# Patient Record
Sex: Male | Born: 1950 | Race: White | Hispanic: No | Marital: Married | Smoking: Former smoker
Health system: Southern US, Community
[De-identification: ages and names within clinical notes are randomized; demographics above are authoritative.]

## PROBLEM LIST (undated history)

## (undated) HISTORY — PX: OTHER SURGICAL HISTORY: SHX169

---

## 2011-09-19 DIAGNOSIS — D352 Benign neoplasm of pituitary gland: Secondary | ICD-10-CM | POA: Insufficient documentation

## 2017-07-08 DIAGNOSIS — N400 Enlarged prostate without lower urinary tract symptoms: Secondary | ICD-10-CM | POA: Insufficient documentation

## 2017-07-29 DIAGNOSIS — C61 Malignant neoplasm of prostate: Secondary | ICD-10-CM | POA: Insufficient documentation

## 2017-10-14 DIAGNOSIS — C61 Malignant neoplasm of prostate: Secondary | ICD-10-CM

## 2017-10-14 HISTORY — DX: Malignant neoplasm of prostate: C61

## 2019-11-14 ENCOUNTER — Ambulatory Visit: Payer: Self-pay

## 2019-11-21 ENCOUNTER — Ambulatory Visit: Payer: Self-pay

## 2019-11-25 ENCOUNTER — Ambulatory Visit: Payer: Self-pay

## 2020-03-29 DIAGNOSIS — H25043 Posterior subcapsular polar age-related cataract, bilateral: Secondary | ICD-10-CM | POA: Insufficient documentation

## 2021-03-07 ENCOUNTER — Observation Stay (HOSPITAL_BASED_OUTPATIENT_CLINIC_OR_DEPARTMENT_OTHER)
Admission: EM | Admit: 2021-03-07 | Discharge: 2021-03-08 | Disposition: A | Discharge status: Home / Self Care | Payer: Medicare HMO | Attending: Internal Medicine | Admitting: Internal Medicine

## 2021-03-07 ENCOUNTER — Encounter (HOSPITAL_BASED_OUTPATIENT_CLINIC_OR_DEPARTMENT_OTHER): Payer: Self-pay

## 2021-03-07 ENCOUNTER — Emergency Department (HOSPITAL_COMMUNITY): Payer: Medicare HMO

## 2021-03-07 ENCOUNTER — Other Ambulatory Visit: Payer: Self-pay

## 2021-03-07 DIAGNOSIS — E785 Hyperlipidemia, unspecified: Secondary | ICD-10-CM | POA: Diagnosis not present

## 2021-03-07 DIAGNOSIS — Z923 Personal history of irradiation: Secondary | ICD-10-CM | POA: Diagnosis not present

## 2021-03-07 DIAGNOSIS — I639 Cerebral infarction, unspecified: Secondary | ICD-10-CM | POA: Diagnosis present

## 2021-03-07 DIAGNOSIS — I6389 Other cerebral infarction: Principal | ICD-10-CM | POA: Insufficient documentation

## 2021-03-07 DIAGNOSIS — Z86018 Personal history of other benign neoplasm: Secondary | ICD-10-CM | POA: Diagnosis not present

## 2021-03-07 DIAGNOSIS — H532 Diplopia: Secondary | ICD-10-CM | POA: Diagnosis present

## 2021-03-07 DIAGNOSIS — I10 Essential (primary) hypertension: Secondary | ICD-10-CM | POA: Diagnosis not present

## 2021-03-07 DIAGNOSIS — Z8546 Personal history of malignant neoplasm of prostate: Secondary | ICD-10-CM | POA: Diagnosis not present

## 2021-03-07 DIAGNOSIS — Z20822 Contact with and (suspected) exposure to covid-19: Secondary | ICD-10-CM | POA: Insufficient documentation

## 2021-03-07 DIAGNOSIS — R41841 Cognitive communication deficit: Secondary | ICD-10-CM | POA: Diagnosis not present

## 2021-03-07 DIAGNOSIS — Z87891 Personal history of nicotine dependence: Secondary | ICD-10-CM | POA: Diagnosis not present

## 2021-03-07 DIAGNOSIS — E119 Type 2 diabetes mellitus without complications: Secondary | ICD-10-CM | POA: Diagnosis not present

## 2021-03-07 DIAGNOSIS — R29898 Other symptoms and signs involving the musculoskeletal system: Secondary | ICD-10-CM | POA: Insufficient documentation

## 2021-03-07 LAB — CBC
HCT: 44 % (ref 39.0–52.0)
Hemoglobin: 15 g/dL (ref 13.0–17.0)
MCH: 30.8 pg (ref 26.0–34.0)
MCHC: 34.1 g/dL (ref 30.0–36.0)
MCV: 90.3 fL (ref 80.0–100.0)
Platelets: 135 10*3/uL — ABNORMAL LOW (ref 150–400)
RBC: 4.87 MIL/uL (ref 4.22–5.81)
RDW: 13.1 % (ref 11.5–15.5)
WBC: 5.2 10*3/uL (ref 4.0–10.5)
nRBC: 0 % (ref 0.0–0.2)

## 2021-03-07 LAB — BASIC METABOLIC PANEL
Anion gap: 8 (ref 5–15)
BUN: 16 mg/dL (ref 8–23)
CO2: 25 mmol/L (ref 22–32)
Calcium: 9 mg/dL (ref 8.9–10.3)
Chloride: 106 mmol/L (ref 98–111)
Creatinine, Ser: 0.83 mg/dL (ref 0.61–1.24)
GFR, Estimated: >60 mL/min (ref >60)
Glucose, Bld: 101 mg/dL — ABNORMAL HIGH (ref 70–99)
Potassium: 4.2 mmol/L (ref 3.5–5.1)
Sodium: 139 mmol/L (ref 135–145)

## 2021-03-07 LAB — RESP PANEL BY RT-PCR (FLU A&B, COVID) ARPGX2
Influenza A by PCR: NEGATIVE
Influenza B by PCR: NEGATIVE
SARS Coronavirus 2 by RT PCR: NEGATIVE

## 2021-03-07 IMAGING — MR MR HEAD WO/W CM
7 of 14 series · 22 of 48 positions shown · IV contrast (gadavist)
Comparison: None.

CLINICAL DATA: Episode of double vision 2 days ago which has
resolved. History of pituitary adenoma. Transient ischemic attack.

EXAM:
MRI HEAD WITHOUT AND WITH CONTRAST
TECHNIQUE: Multiplanar, multiecho pulse sequences of the brain and surrounding
structures were obtained without and with intravenous contrast.
CONTRAST:  10mL GADAVIST GADOBUTROL 1 MMOL/ML IV SOLN

[Series 2: DWI · axial · 3.0mm · 0.94mm/px · z∈[-51,+111]mm · 6 of 110 slices shown (1 of 2)]
[im 1/110]
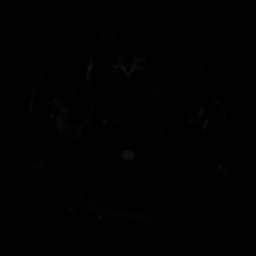
[im 22/110]
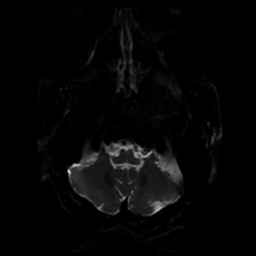
[im 44/110]
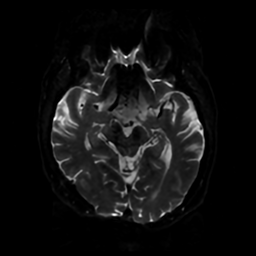
[im 66/110]
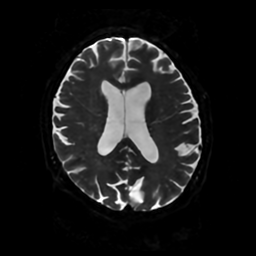
[im 88/110]
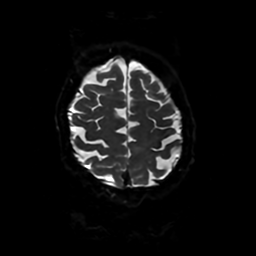
[im 110/110]
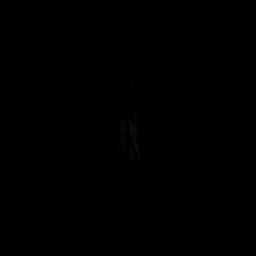

[Series 3: DWI · coronal · 4.0mm · 0.94mm/px · 5 of 74 slices shown (2 of 2)]
[im 1/74]
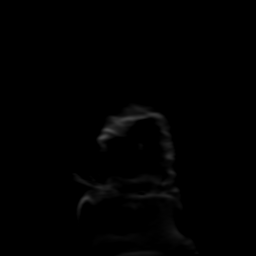
[im 19/74]
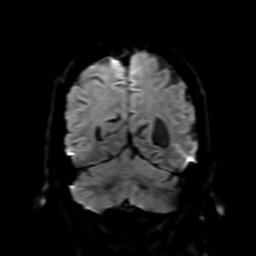
[im 37/74]
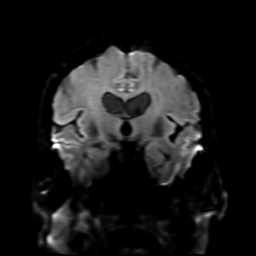
[im 55/74]
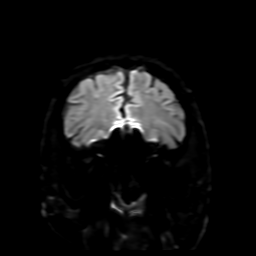
[im 74/74]
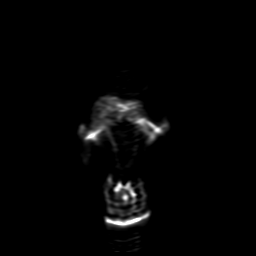

[Series 4: FLAIR · sagittal · 5.0mm · 0.23mm/px · 2 of 30 slices shown (1 of 2)]
[im 1/30]
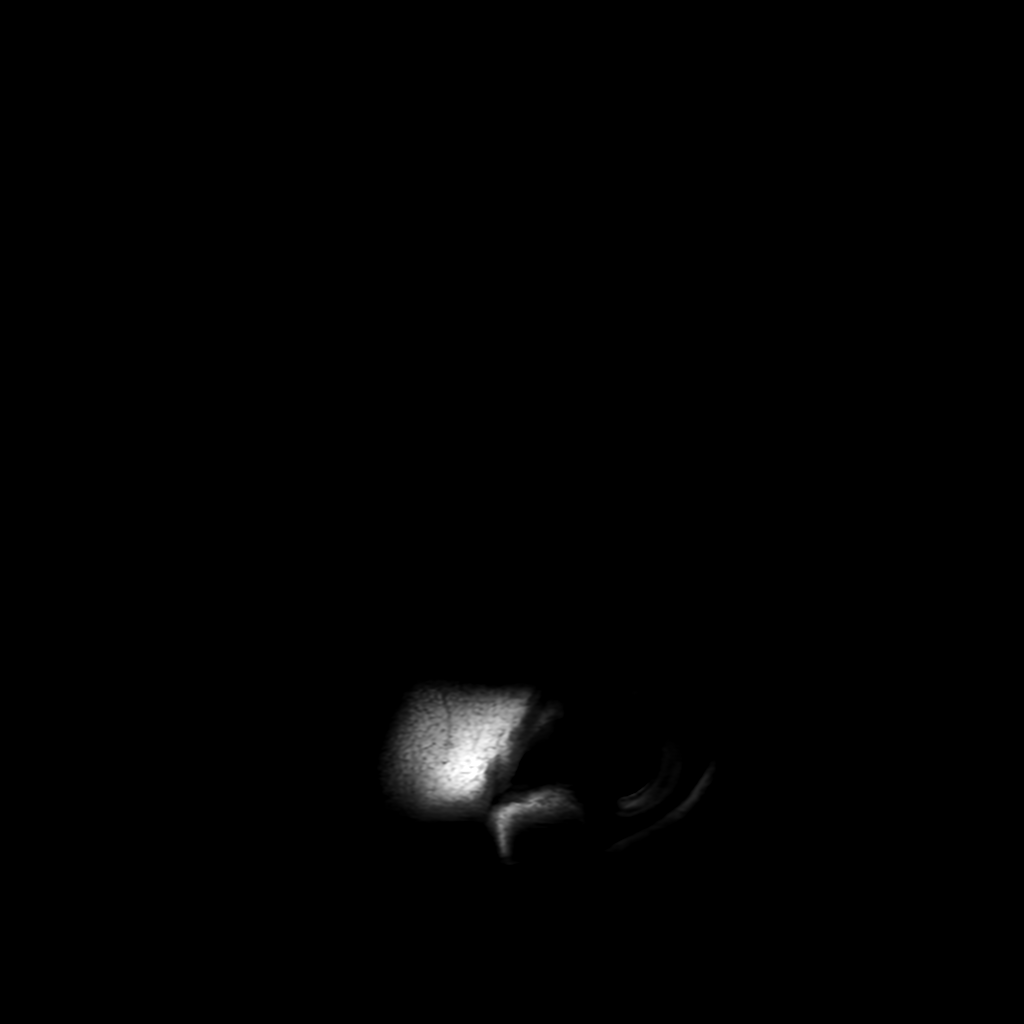
[im 30/30]
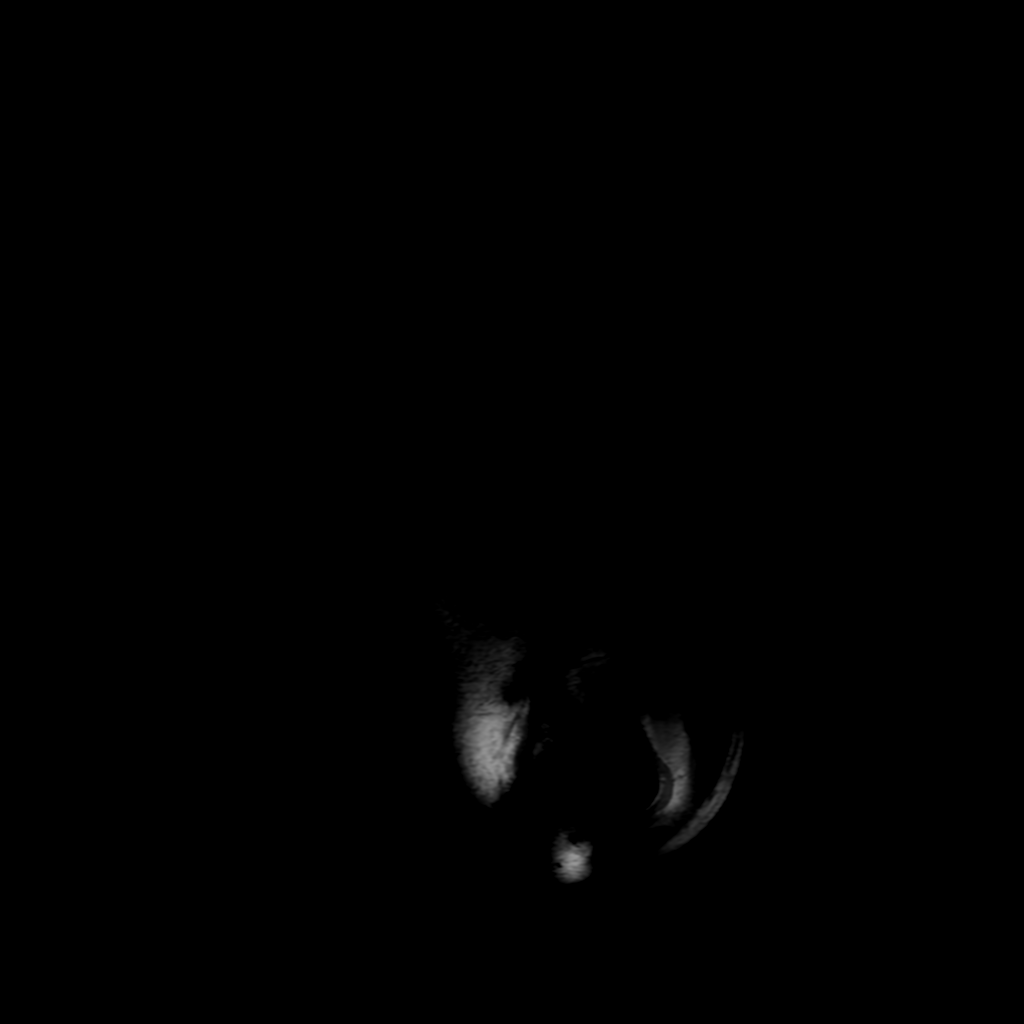

[Series 6: FLAIR · axial · 3.0mm · 0.45mm/px · z∈[-48,+114]mm · 2 of 28 slices shown (2 of 2)]
[im 1/28]
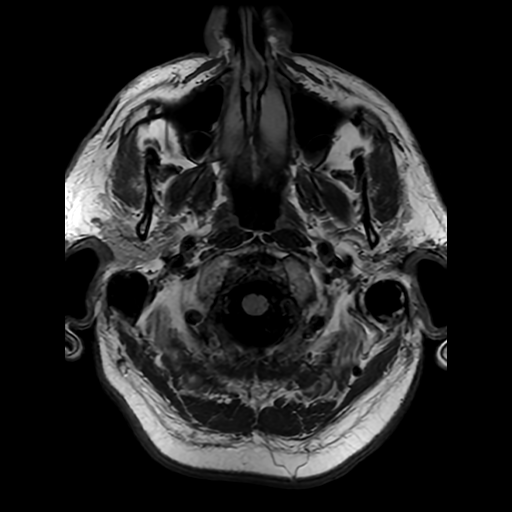
[im 28/28]
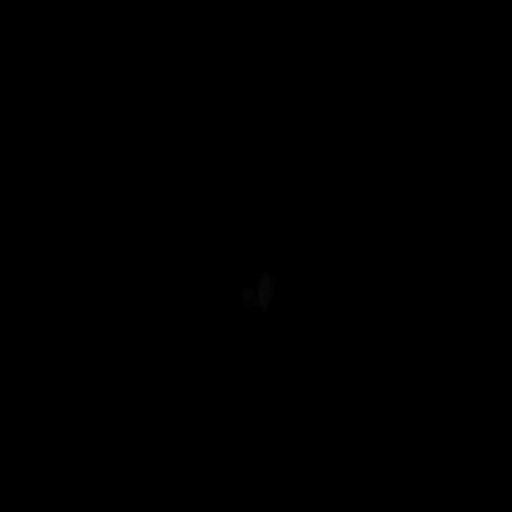

[Series 12: FLAIR post-contrast · sagittal · 5.0mm · 0.47mm/px · 2 of 30 slices shown]
[im 1/30]
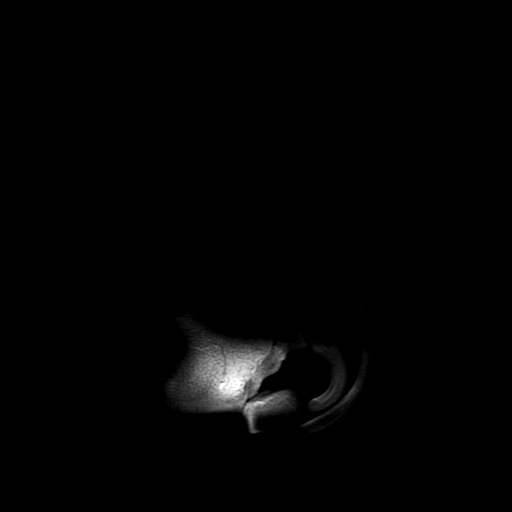
[im 30/30]
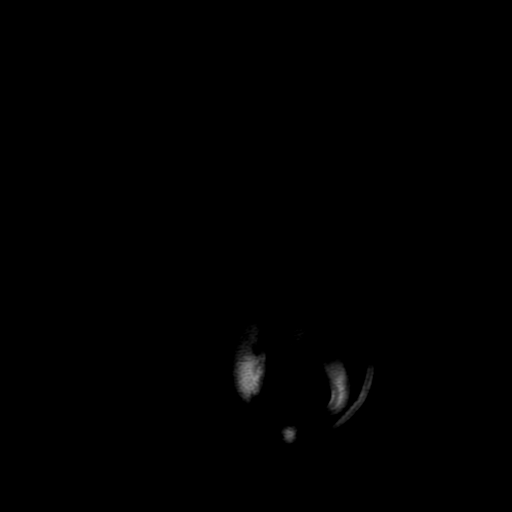

[Series 250: ADC · axial · 3.0mm · 0.94mm/px · z∈[-51,+111]mm · 3 of 55 slices shown (1 of 2)]
[im 1/55]
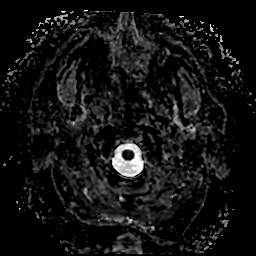
[im 28/55]
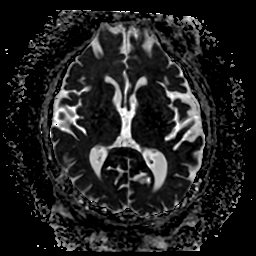
[im 55/55]
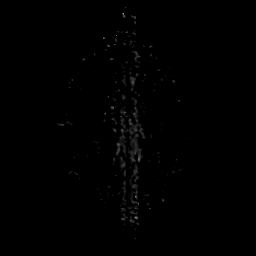

[Series 350: ADC · coronal · 4.0mm · 0.94mm/px · 2 of 37 slices shown (2 of 2)]
[im 1/37]
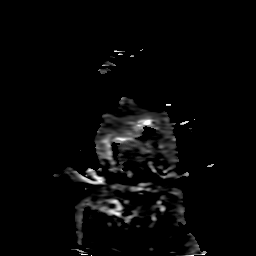
[im 37/37]
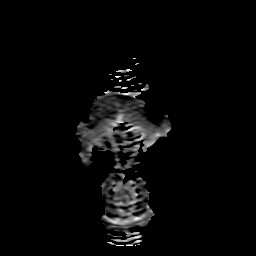

[22 of 48 positions shown; findings below may reference images not displayed]

FINDINGS: Brain: Diffusion imaging shows a punctate acute infarction in the
right posterior parietal cortex, diffusion axial image 39 and
diffusion coronal image 6. No other acute infarction. No abnormality
seen affecting the brainstem or cerebellum. Cerebral hemispheres
otherwise show mild chronic small-vessel ischemic change of the deep
and subcortical white matter considering age. No large vessel
territory infarction. No mass lesion, hemorrhage, hydrocephalus or
extra-axial collection. After contrast administration, no abnormal
enhancement occurs.

Vascular: Major vessels at the base of the brain show flow.

Skull and upper cervical spine: Negative

Sinuses/Orbits: Clear/normal

Other: None
IMPRESSION: Age related volume loss and mild chronic small-vessel change of the
cerebral hemispheric white matter. Acute punctate infarction at the
right posterior parietal cortex as outlined above, consistent with a
single micro embolic infarction.

## 2021-03-07 MED ORDER — STROKE: EARLY STAGES OF RECOVERY BOOK
Freq: Once | Status: DC
  Filled 2021-03-07: qty 1

## 2021-03-07 MED ORDER — ENOXAPARIN SODIUM 40 MG/0.4ML IJ SOSY
40.0000 mg | PREFILLED_SYRINGE | INTRAMUSCULAR | Status: DC
  Administered 2021-03-08: 40 mg via SUBCUTANEOUS
  Filled 2021-03-07: qty 0.4

## 2021-03-07 MED ORDER — CLOPIDOGREL BISULFATE 75 MG PO TABS
75.0000 mg | ORAL_TABLET | Freq: Every day | ORAL | Status: DC
  Administered 2021-03-07 – 2021-03-08 (×2): 75 mg via ORAL
  Filled 2021-03-07 (×2): qty 1

## 2021-03-07 MED ORDER — GADOBUTROL 1 MMOL/ML IV SOLN
10.0000 mL | Freq: Once | INTRAVENOUS | Status: AC | PRN
  Administered 2021-03-07: 10 mL via INTRAVENOUS

## 2021-03-07 MED ORDER — ASPIRIN EC 81 MG PO TBEC
81.0000 mg | DELAYED_RELEASE_TABLET | Freq: Every day | ORAL | Status: DC
  Administered 2021-03-07 – 2021-03-08 (×2): 81 mg via ORAL
  Filled 2021-03-07 (×2): qty 1

## 2021-03-07 MED ORDER — AMLODIPINE BESYLATE 5 MG PO TABS
5.0000 mg | ORAL_TABLET | Freq: Once | ORAL | Status: AC
  Administered 2021-03-07: 5 mg via ORAL
  Filled 2021-03-07: qty 1

## 2021-03-07 MED ORDER — AMLODIPINE BESYLATE 5 MG PO TABS
5.0000 mg | ORAL_TABLET | Freq: Every day | ORAL | Status: DC
  Administered 2021-03-08: 5 mg via ORAL
  Filled 2021-03-07: qty 1

## 2021-03-07 NOTE — ED Notes (Signed)
Patient transported to MRI 
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
## 2021-03-07 NOTE — ED Notes (Signed)
Pt ambulatory to and from restroom.
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
## 2021-03-07 NOTE — ED Notes (Signed)
Called [facility] Charge RN -Gabriel Cirri, RN to notify patient is being transported.  Dr Vanita Panda is accepting EDP

## 2021-03-07 NOTE — ED Provider Notes (Signed)
[facility] EMERGENCY DEPT Provider Note   CSN: [id number] Arrival date & time: 03/07/21  1103     History CC:  Blurred vision  Garrett Garcia is a 70 y.o. male with a distant history of prostate cancer, cataracts, pituitary adenoma, presenting to emergency department with an episode of double vision.Marland Kitchen  He reports this occurred approximately 2 days ago while he was driving.  He states that for about 60 seconds, he felt like he was having double vision, where "the lines of the road were stacked up on top of each other".  He denies loss of vision.  He denies pain in his eyes at the time.  He denies headache, vertigo, any other symptoms, but states that he was driving and focused on driving.  He is unsure whether it was 1 eye or both eyes.  He reports his symptoms resolved and have not come back since that episode.  Is never had this happen to him before.  He has felt well otherwise.  He is asymptomatic in the ER.  He does report he sees an ophthalmologist regularly, was told he has cataracts, but no other significant disease of the eye including macular degeneration.  He is nearsighted, wears glasses for distance vision.  He denies any known history of TIA or stroke.  He denies history of diabetes, hypertension, high cholesterol (states cholesterol is really good), smoking.  He no longer takes any blood thinners including aspirin.  He denies any known cardiac issues or history of arrhythmia.  He reports a history of pituitary adenoma which underwent gamma knife surgery with [facility] neurosurgery several years ago.  He has not had recent brain imaging since then.  He has not had any additional issues since then.  HPI     Past Medical History:  Diagnosis Date  . Prostate cancer ([place]) 2019   Treated with Radiation     There are no problems to display for this patient.   No family history on file.     Home Medications Prior to Admission medications   Not on File     Allergies    Patient has no allergy information on record.  Review of Systems   Review of Systems  Constitutional: Negative for chills and fever.  Eyes: Negative for photophobia, pain, discharge, redness, itching and visual disturbance.  Respiratory: Negative for cough and shortness of breath.   Cardiovascular: Negative for chest pain and palpitations.  Gastrointestinal: Negative for abdominal pain and vomiting.  Genitourinary: Negative for hematuria.  Musculoskeletal: Negative for arthralgias and back pain.  Skin: Negative for color change and rash.  Neurological: Negative for dizziness, syncope, facial asymmetry, speech difficulty, light-headedness, numbness and headaches.  All other systems reviewed and are negative.   Physical Exam Updated Vital Signs BP (!) 164/100 (BP Location: Right Arm)   Pulse 79   Temp 97.8 F (36.6 C) (Oral)   Resp 18   Ht 6\' 3"  (1.905 m)   Wt 113.4 kg   SpO2 100%   BMI 31.25 kg/m   Physical Exam Constitutional:      General: He is not in acute distress. HENT:     Head: Normocephalic and atraumatic.  Eyes:     General: No visual field deficit.    Extraocular Movements: Extraocular movements intact.     Right eye: Normal extraocular motion.     Left eye: Normal extraocular motion.     Conjunctiva/sclera: Conjunctivae normal.     Right eye: Right conjunctiva is  not injected. No chemosis.    Left eye: Left conjunctiva is not injected. No chemosis.    Pupils: Pupils are equal, round, and reactive to light.     Comments: Visual fields intact  Cardiovascular:     Rate and Rhythm: Normal rate and regular rhythm.     Pulses: Normal pulses.  Pulmonary:     Effort: Pulmonary effort is normal. No respiratory distress.  Skin:    General: Skin is warm and dry.  Neurological:     General: No focal deficit present.     Mental Status: He is alert and oriented to person, place, and time. Mental status is at baseline.     GCS: GCS eye subscore is  4. GCS verbal subscore is 5. GCS motor subscore is 6.     Cranial Nerves: Cranial nerves are intact. No cranial nerve deficit, dysarthria or facial asymmetry.     Sensory: No sensory deficit.     Motor: No weakness.     Coordination: Finger-Nose-Finger Test normal.     Gait: Gait normal.  Psychiatric:        Mood and Affect: Mood normal.        Behavior: Behavior normal.     ED Results / Procedures / Treatments   Labs (all labs ordered are listed, but only abnormal results are displayed) Labs Reviewed  BASIC METABOLIC PANEL  CBC    EKG None  Radiology No results found.  Procedures Procedures   Medications Ordered in ED Medications - No data to display  ED Course  I have reviewed the triage vital signs and the nursing notes.  Pertinent labs & imaging results that were available during my care of the patient were reviewed by me and considered in my medical decision making (see chart for details).  This is a 70 year old male presenting to the emergency department with a transient episode of double vision that occurred 2 days ago while driving.  Differential diagnosis includes TIA versus pituitary adenoma complication including hemorrhage or enlargement versus other.  Less likely transient ocular nerve palsy given how rapid the episode was and how quickly resolved.  I doubt brain bleed or subarachnoid hemorrhage.  He has no headache and a benign and normal neurological exam at this time.  His vision and visual fields are intact on exam.  There was no loss of vision reported.  I doubt this is an intraocular emergency including retinal artery occlusion, retinal detachment.  No signs or symptoms of infection or trauma to the eye  I discussed the case with neurology as noted below.  We will need to transfer the patient to [facility] for MRI of the brain with and without contrast.  Clinical Course as of 03/07/21 1216  Wed Mar 07, 2021  1206 I spoke to Dr Quinn Axe from neurology  who agreed with transfer to [facility] for MRI brain with and without contrast to evaluate for TIA and/or pituitary bleed or complication.  As the patient is completely asymptomatic and has a safe ride, I think is reasonable for his wife to drive him directly to the ER.  I do believe this to be faster than waiting on an ambulance.  I spoken to Dr Vanita Panda at [facility] ED who is aware of the patient's transfer to [facility] and MRI plans.  The patient himself verbalized agreement and understanding of this plan. [MT]    Clinical Course User Index [MT] Romanda Turrubiates, Carola Rhine, MD     Final Clinical Impression(s) /  ED Diagnoses Final diagnoses:  Double vision    Rx / DC Orders ED Discharge Orders    None       Wyvonnia Dusky, MD 03/07/21 1216

## 2021-03-07 NOTE — ED Provider Notes (Signed)
[facility] EMERGENCY DEPARTMENT Provider Note   CSN: [id number] Arrival date & time: 03/07/21  1103     History Chief Complaint  Patient presents with  . MRI/DB    Garrett Garcia is a 70 y.o. male who presents from [facility] emergency department on direction of attending physician Dr. Langston Masker and Dr. Quinn Axe, neurology,  for MRI.  Patient with history of pituitary adenoma who underwent gamma knife surgery at [facility] neurosurgery several years ago.  States he has not followed up with them since COVID started.  He presents to the ED today because approximately 2 days ago he experienced an episode of double vision while he was driving that lasted approximately 60 seconds.  He denies any loss in his visual field, denies any pain in his eyes or headaches.  Denies any dizziness or lightheadedness since that time.  He has history of cataracts but no other significant eye disease.  ED attending at prior facility spoke with attending physician of neurology, Dr. Alferd Apa, who recommended MRI of the brain with and without contrast for further evaluation of possible ischemic event versus Pituitary abnormality given patient's history.  HPI     Past Medical History:  Diagnosis Date  . Prostate cancer ([place]) 2019   Treated with Radiation     There are no problems to display for this patient.        No family history on file.     Home Medications Prior to Admission medications   Not on File    Allergies    Patient has no allergy information on record.  Review of Systems   Review of Systems  Constitutional: Negative.   HENT: Negative.   Eyes: Positive for visual disturbance. Negative for photophobia, pain and redness.  Respiratory: Negative.   Cardiovascular: Negative.   Gastrointestinal: Negative.   Genitourinary: Negative.   Musculoskeletal: Negative.   Neurological: Negative.  Negative for dizziness, syncope, facial asymmetry, weakness and  light-headedness.  Hematological: Negative.     Physical Exam Updated Vital Signs BP (!) 142/89   Pulse 63   Temp 98 F (36.7 C) (Oral)   Resp 18   Ht 6\' 3"  (1.905 m)   Wt 113.4 kg   SpO2 100%   BMI 31.25 kg/m   Physical Exam Vitals and nursing note reviewed.  Constitutional:      Appearance: He is obese. He is not ill-appearing or toxic-appearing.  HENT:     Head: Normocephalic and atraumatic.     Nose: Nose normal.     Mouth/Throat:     Mouth: Mucous membranes are moist.     Pharynx: Oropharynx is clear. Uvula midline. No oropharyngeal exudate, posterior oropharyngeal erythema or uvula swelling.     Tonsils: No tonsillar exudate.  Eyes:     General: Lids are normal. Vision grossly intact.        Right eye: No discharge.        Left eye: No discharge.     Extraocular Movements: Extraocular movements intact.     Conjunctiva/sclera: Conjunctivae normal.     Pupils: Pupils are equal, round, and reactive to light.     Visual Fields: Right eye visual fields normal and left eye visual fields normal.  Neck:     Trachea: Trachea normal.  Cardiovascular:     Rate and Rhythm: Normal rate and regular rhythm.     Pulses: Normal pulses.     Heart sounds: Normal heart sounds. No murmur heard.  Pulmonary:     Effort: Pulmonary effort is normal. No tachypnea, bradypnea, accessory muscle usage or respiratory distress.     Breath sounds: Normal breath sounds. No wheezing or rales.  Chest:     Chest wall: No mass, lacerations, deformity, swelling, tenderness, crepitus or edema.  Abdominal:     General: Bowel sounds are normal. There is no distension.     Palpations: Abdomen is soft.     Tenderness: There is no abdominal tenderness. There is no right CVA tenderness, left CVA tenderness, guarding or rebound.  Musculoskeletal:        General: No deformity. Normal range of motion.     Cervical back: Normal range of motion and neck supple. No crepitus. No pain with movement, spinous  process tenderness or muscular tenderness.     Right lower leg: No edema.     Left lower leg: No edema.     Comments: Normal and symmetric strength and grip, elbow flexion extension, knee flexion extension, ankle plantar and dorsiflexion bilaterally.  Lymphadenopathy:     Cervical: No cervical adenopathy.  Skin:    General: Skin is warm and dry.     Capillary Refill: Capillary refill takes less than 2 seconds.     Findings: No rash.  Neurological:     General: No focal deficit present.     Mental Status: He is alert and oriented to person, place, and time. Mental status is at baseline.     GCS: GCS eye subscore is 4. GCS verbal subscore is 5. GCS motor subscore is 6.     Cranial Nerves: Cranial nerves are intact.     Sensory: Sensation is intact.     Motor: Motor function is intact.     Coordination: Coordination is intact.     Gait: Gait is intact.  Psychiatric:        Mood and Affect: Mood normal.    ED Results / Procedures / Treatments   Labs (all labs ordered are listed, but only abnormal results are displayed) Labs Reviewed  CBC - Abnormal; Notable for the following components:      Result Value   Platelets 135 (*)    All other components within normal limits  BASIC METABOLIC PANEL - Abnormal; Notable for the following components:   Glucose, Bld 101 (*)    All other components within normal limits  RESP PANEL BY RT-PCR (FLU A&B, COVID) ARPGX2  HEMOGLOBIN A1C  LIPID PANEL   EKG EKG Interpretation  Date/Time:  Wednesday Mar 07 2021 12:14:57 EDT Ventricular Rate:  67 PR Interval:  187 QRS Duration: 91 QT Interval:  396 QTC Calculation: 418 R Axis:   18 Text Interpretation: Sinus rhythm Consider left atrial enlargement RSR' in V1 or V2, right VCD or RVH Confirmed by Octaviano Glow [phone]) on 03/07/2021 12:54:57 PM  Radiology MR Brain W and Wo Contrast  Result Date: 03/07/2021 CLINICAL DATA:  Episode of double vision 2 days ago which has resolved. History of  pituitary adenoma. Transient ischemic attack. EXAM: MRI HEAD WITHOUT AND WITH CONTRAST TECHNIQUE: Multiplanar, multiecho pulse sequences of the brain and surrounding structures were obtained without and with intravenous contrast. CONTRAST:  55mL GADAVIST GADOBUTROL 1 MMOL/ML IV SOLN COMPARISON:  None. FINDINGS: Brain: Diffusion imaging shows a punctate acute infarction in the right posterior parietal cortex, diffusion axial image 39 and diffusion coronal image 6. No other acute infarction. No abnormality seen affecting the brainstem or cerebellum. Cerebral hemispheres otherwise show mild chronic small-vessel ischemic change  of the deep and subcortical white matter considering age. No large vessel territory infarction. No mass lesion, hemorrhage, hydrocephalus or extra-axial collection. After contrast administration, no abnormal enhancement occurs. Vascular: Major vessels at the base of the brain show flow. Skull and upper cervical spine: Negative Sinuses/Orbits: Clear/normal Other: None IMPRESSION: Age related volume loss and mild chronic small-vessel change of the cerebral hemispheric white matter. Acute punctate infarction at the right posterior parietal cortex as outlined above, consistent with a single micro embolic infarction. Electronically Signed   By: Nelson Chimes M.D.   On: 03/07/2021 15:49    Procedures Procedures   Medications Ordered in ED Medications  aspirin EC tablet 81 mg (81 mg Oral Given 03/07/21 1720)  clopidogrel (PLAVIX) tablet 75 mg (75 mg Oral Given 03/07/21 1720)  gadobutrol (GADAVIST) 1 MMOL/ML injection 10 mL (10 mLs Intravenous Contrast Given 03/07/21 1534)  amLODipine (NORVASC) tablet 5 mg (5 mg Oral Given 03/07/21 1719)    ED Course  I have reviewed the triage vital signs and the nursing notes.  Pertinent labs & imaging results that were available during my care of the patient were reviewed by me and considered in my medical decision making (see chart for  details).  Clinical Course as of 03/07/21 1752  Wed Mar 07, 2021  1206 I spoke to Dr Quinn Axe from neurology who agreed with transfer to [facility] for MRI brain with and without contrast to evaluate for TIA and/or pituitary bleed or complication.  As the patient is completely asymptomatic and has a safe ride, I think is reasonable for his wife to drive him directly to the ER.  I do believe this to be faster than waiting on an ambulance.  I spoken to Dr Vanita Panda at [facility] ED who is aware of the patient's transfer to [facility] and MRI plans.  The patient himself verbalized agreement and understanding of this plan. [MT]  1609 Consult call from Dr. Leonel Ramsay, neurology, who recommends admission to the medicine service for completion of embolic work-up.  Neurology PA at the bedside at this time to discuss findings with the patient.  He is amenable to plan for admission. [RS]  7867 Consult to hospitalist, Dr. Rowe Pavy, who is agreeable to seeing this patient admitting to his service.  I appreciate his collaboration care of this patient. [RS]    Clinical Course User Index [MT] Trifan, Carola Rhine, MD [RS] Rasmus Preusser, Sharlene Dory   MDM Rules/Calculators/A&P                         70 year old male who presents for MRI of the brain with and without contrast for evaluation of possible ischemic event versus pituitary abnormality with history of pituitary adenoma.  Hypertensive on intake, vital signs otherwise normal.  Cardiopulmonary exam is normal, abdominal exam is benign.  Patient is neurologically intact without focal deficit on exam.  Basic labs were obtained at other facility, CBC with thrombocytopenia of 135, BMP unremarkable.  EKG revealed normal sinus rhythm without ischemic changes.  MRI of the brain revealed acute punctate infarction at the right posterior parietal cortex consistent with single microembolic infarction.  Awaiting repeat neurology consult to discuss.  Case discussed with Dr. Leonel Ramsay  from neurology in addition to admit for embolic work-up.   Sye voiced understanding of medical evaluation and treatment plan.  Each of his questions was answered to his expressed satisfaction.  He is amenable to plan for admission at this time.  Case discussed  with admitting team as above.  Patient stable for admission.  This chart was dictated using voice recognition software, Dragon. Despite the best efforts of this provider to proofread and correct errors, errors may still occur which can change documentation meaning.  Final Clinical Impression(s) / ED Diagnoses Final diagnoses:  Double vision    Rx / DC Orders ED Discharge Orders    None       Emeline Darling, PA-C 03/07/21 1752    Lorelle Gibbs, DO 03/10/21 1520

## 2021-03-08 ENCOUNTER — Inpatient Hospital Stay (HOSPITAL_BASED_OUTPATIENT_CLINIC_OR_DEPARTMENT_OTHER): Payer: Medicare HMO

## 2021-03-08 ENCOUNTER — Telehealth: Payer: Self-pay | Admitting: Cardiology

## 2021-03-08 ENCOUNTER — Other Ambulatory Visit: Payer: Self-pay | Admitting: Cardiology

## 2021-03-08 ENCOUNTER — Inpatient Hospital Stay (HOSPITAL_COMMUNITY): Payer: Medicare HMO

## 2021-03-08 DIAGNOSIS — I639 Cerebral infarction, unspecified: Secondary | ICD-10-CM

## 2021-03-08 DIAGNOSIS — I6389 Other cerebral infarction: Secondary | ICD-10-CM

## 2021-03-08 DIAGNOSIS — H532 Diplopia: Secondary | ICD-10-CM | POA: Diagnosis not present

## 2021-03-08 LAB — RAPID URINE DRUG SCREEN, HOSP PERFORMED
Amphetamines: NOT DETECTED
Barbiturates: NOT DETECTED
Benzodiazepines: NOT DETECTED
Cocaine: NOT DETECTED
Opiates: NOT DETECTED
Tetrahydrocannabinol: NOT DETECTED

## 2021-03-08 LAB — CBC
HCT: 42.1 % (ref 39.0–52.0)
Hemoglobin: 14.3 g/dL (ref 13.0–17.0)
MCH: 31.2 pg (ref 26.0–34.0)
MCHC: 34 g/dL (ref 30.0–36.0)
MCV: 91.9 fL (ref 80.0–100.0)
Platelets: 135 10*3/uL — ABNORMAL LOW (ref 150–400)
RBC: 4.58 MIL/uL (ref 4.22–5.81)
RDW: 13.2 % (ref 11.5–15.5)
WBC: 6.8 10*3/uL (ref 4.0–10.5)
nRBC: 0 % (ref 0.0–0.2)

## 2021-03-08 LAB — LIPID PANEL
Cholesterol: 163 mg/dL (ref 0–200)
HDL: 47 mg/dL (ref >40)
LDL Cholesterol: 102 mg/dL — ABNORMAL HIGH (ref 0–99)
Total CHOL/HDL Ratio: 3.5 RATIO
Triglycerides: 71 mg/dL (ref <150)
VLDL: 14 mg/dL (ref 0–40)

## 2021-03-08 LAB — ECHOCARDIOGRAM COMPLETE
Area-P 1/2: 2.8 cm2
Height: 75 in
S' Lateral: 2.6 cm
Weight: 4000 oz

## 2021-03-08 LAB — COMPREHENSIVE METABOLIC PANEL
ALT: 18 U/L (ref 0–44)
AST: 20 U/L (ref 15–41)
Albumin: 3.8 g/dL (ref 3.5–5.0)
Alkaline Phosphatase: 61 U/L (ref 38–126)
Anion gap: 6 (ref 5–15)
BUN: 11 mg/dL (ref 8–23)
CO2: 25 mmol/L (ref 22–32)
Calcium: 9 mg/dL (ref 8.9–10.3)
Chloride: 107 mmol/L (ref 98–111)
Creatinine, Ser: 0.94 mg/dL (ref 0.61–1.24)
GFR, Estimated: >60 mL/min (ref >60)
Glucose, Bld: 98 mg/dL (ref 70–99)
Potassium: 4.1 mmol/L (ref 3.5–5.1)
Sodium: 138 mmol/L (ref 135–145)
Total Bilirubin: 1.1 mg/dL (ref 0.3–1.2)
Total Protein: 6.5 g/dL (ref 6.5–8.1)

## 2021-03-08 LAB — HEMOGLOBIN A1C
Hgb A1c MFr Bld: 5.3 % (ref 4.8–5.6)
Mean Plasma Glucose: 105 mg/dL

## 2021-03-08 IMAGING — MR MR MRA NECK W/O CM
2 series · 48 of 48 positions shown · non-contrast
Comparison: Previous MRI from [DATE]

CLINICAL DATA: Follow-up examination for acute stroke.

EXAM:
MRA HEAD WITHOUT CONTRAST
MRA NECK WITHOUT CONTRAST
TECHNIQUE: Angiographic images of the Circle of Willis were obtained using MRA
technique without intravenous contrast. Angiographic images of the
neck were obtained using MRA technique without intravenous contrast.
Carotid stenosis measurements (when applicable) are obtained
utilizing NASCET criteria, using the distal internal carotid
diameter as the denominator.

[Series 6: [id number] (better) · axial · 0.8mm · 0.78mm/px · z∈[-178,-1]mm · 41 of 222 slices shown]
[im 1/222]
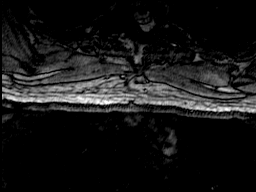
[im 6/222]
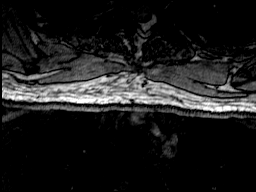
[im 12/222]
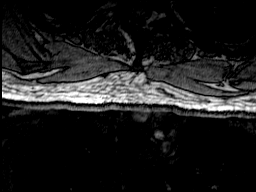
[im 17/222]
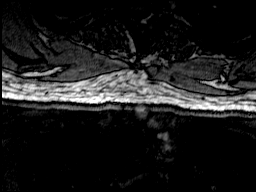
[im 23/222]
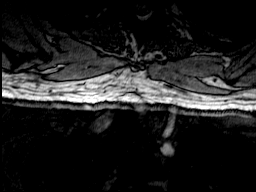
[im 28/222]
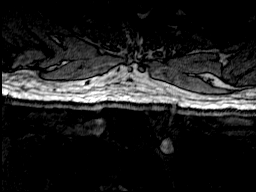
[im 34/222]
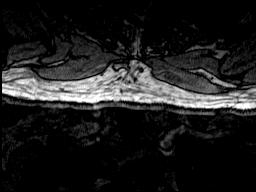
[im 39/222]
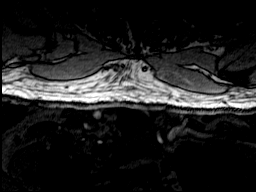
[im 45/222]
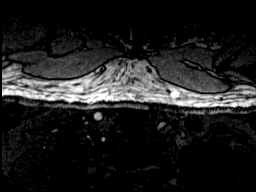
[im 50/222]
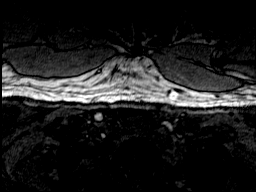
[im 56/222]
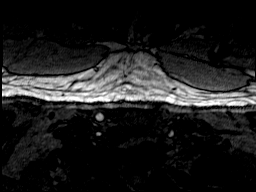
[im 61/222]
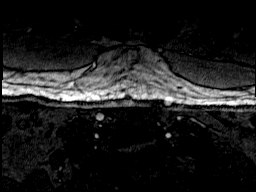
[im 67/222]
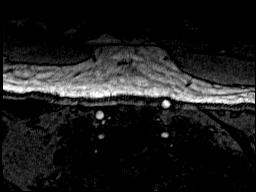
[im 72/222]
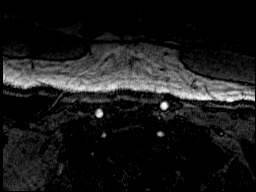
[im 78/222]
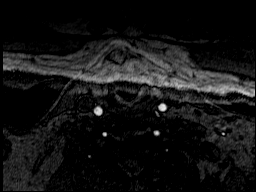
[im 83/222]
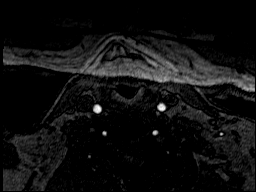
[im 89/222]
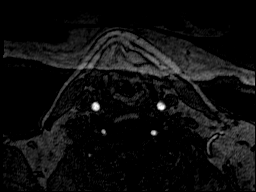
[im 94/222]
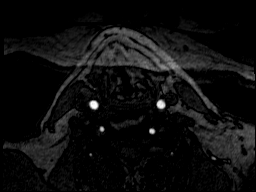
[im 100/222]
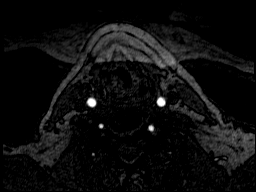
[im 105/222]
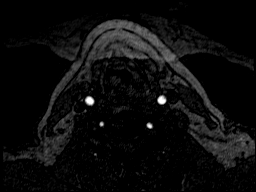
[im 111/222]
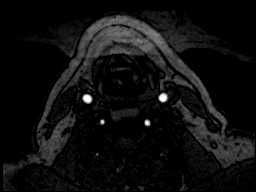
[im 117/222]
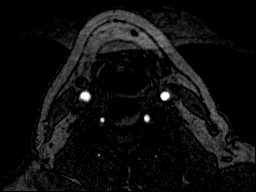
[im 122/222]
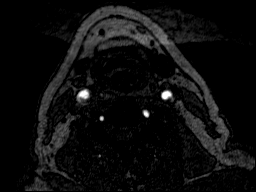
[im 128/222]
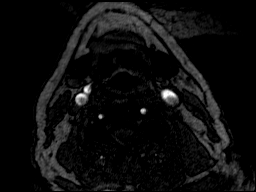
[im 133/222]
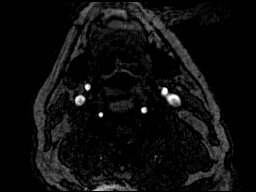
[im 139/222]
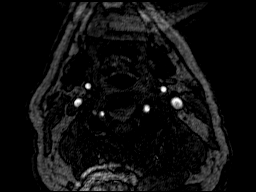
[im 144/222]
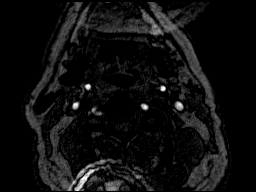
[im 150/222]
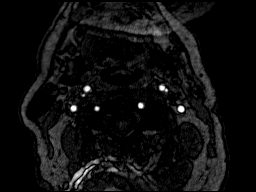
[im 155/222]
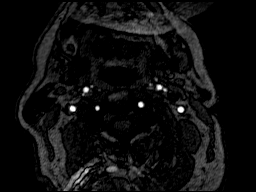
[im 161/222]
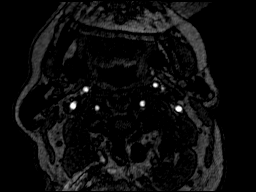
[im 166/222]
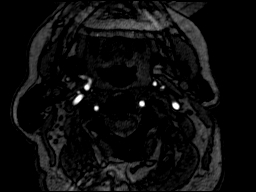
[im 172/222]
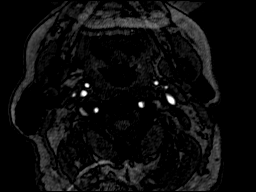
[im 177/222]
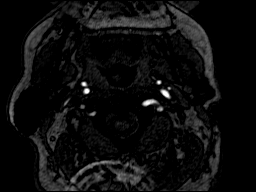
[im 183/222]
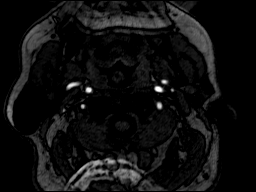
[im 188/222]
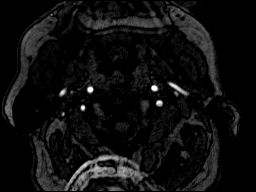
[im 194/222]
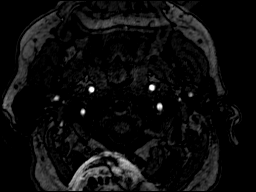
[im 199/222]
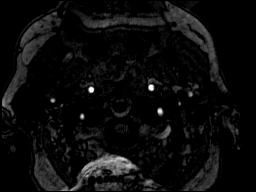
[im 205/222]
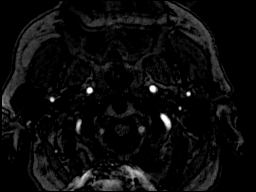
[im 210/222]
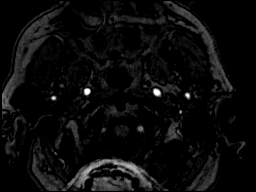
[im 216/222]
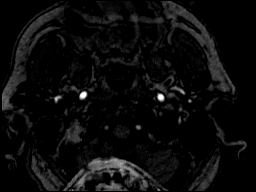
[im 222/222]
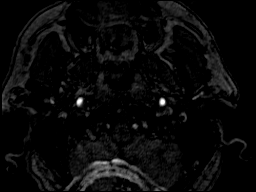

[Series 9: T1 fat-sat · axial · 4.0mm · 0.72mm/px · z∈[-200,-5]mm · 7 of 40 slices shown]
[im 1/40]
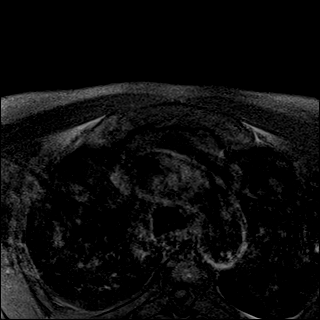
[im 7/40]
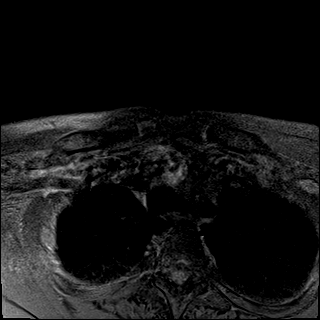
[im 14/40]
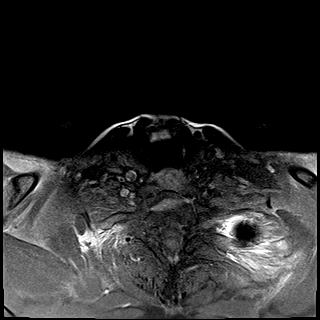
[im 20/40]
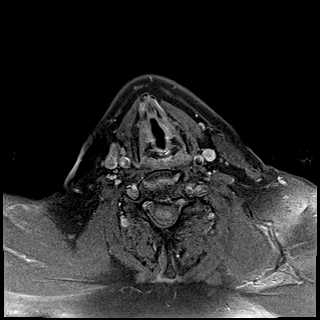
[im 27/40]
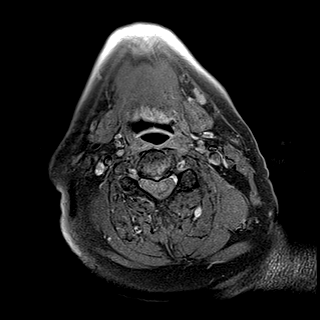
[im 33/40]
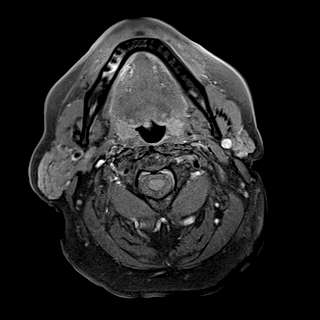
[im 40/40]
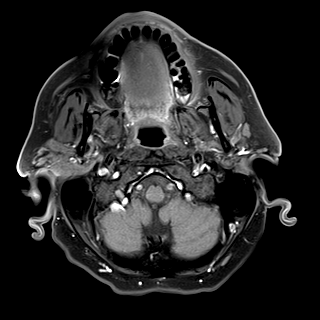

[48 of 48 positions shown; findings below may reference images not displayed]

FINDINGS: MRA HEAD FINDINGS

ANTERIOR CIRCULATION:

Visualized distal cervical segments of the internal carotid arteries
are widely patent with antegrade flow. Petrous, cavernous, and
supraclinoid segments patent without stenosis or other abnormality.
A1 segments patent bilaterally. Normal anterior communicating artery
complex. Anterior cerebral arteries patent to their distal aspects
without stenosis. No M1 stenosis or occlusion. Normal MCA
bifurcations. Distal MCA branches well perfused and symmetric.

POSTERIOR CIRCULATION:

The visualized portions of the vertebral arteries patent without
stenosis. Left vertebral artery dominant. Neither PICA origin
visualized. Basilar patent to its distal aspect without stenosis.
Superior cerebellar arteries patent bilaterally. Right PCA supplied
via the basilar. Left PCA supplied via the basilar as well as a
prominent left posterior communicating artery. Both PCAs well
perfused or distal aspects without stenosis.

No intracranial aneurysm.

MRA NECK FINDINGS

AORTIC ARCH: Visualized aortic arch normal in caliber with normal
branch pattern. No definite stenosis about the origin of the great
vessels, although evaluation somewhat limited due to lack of IV
contrast.

RIGHT CAROTID SYSTEM: Visualized right CCA widely patent to the
bifurcation without stenosis. No significant atheromatous narrowing
about the right carotid bulb. Right ICA patent distally without
stenosis, evidence for dissection or occlusion.

LEFT CAROTID SYSTEM: Visualized left CCA patent to the bifurcation
without stenosis. No significant atheromatous narrowing or
irregularity about the left carotid bulb. Left ICA patent distally
without stenosis, evidence for dissection or occlusion.

VERTEBRAL ARTERIES: Both vertebral arteries arise from the
subclavian arteries. Left vertebral artery slightly dominant.
Vertebral arteries patent within the neck without stenosis, evidence
for dissection or occlusion.
IMPRESSION: 1. Normal intracranial MRA. No large vessel occlusion,
hemodynamically significant stenosis, or other vascular abnormality.
No aneurysm.
2. Normal MRA of the neck. No hemodynamically significant stenosis
within the neck.

## 2021-03-08 IMAGING — MR MR MRA HEAD W/O CM
1 series · 18 of 48 positions shown · non-contrast
Comparison: Previous MRI from [DATE]

CLINICAL DATA: Follow-up examination for acute stroke.

EXAM:
MRA HEAD WITHOUT CONTRAST
MRA NECK WITHOUT CONTRAST
TECHNIQUE: Angiographic images of the Circle of Willis were obtained using MRA
technique without intravenous contrast. Angiographic images of the
neck were obtained using MRA technique without intravenous contrast.
Carotid stenosis measurements (when applicable) are obtained
utilizing NASCET criteria, using the distal internal carotid
diameter as the denominator.

[Series 5: 3d cow · axial · 0.5mm · 0.41mm/px · z∈[-56,+24]mm · 18 of 172 slices shown]
[im 1/172]
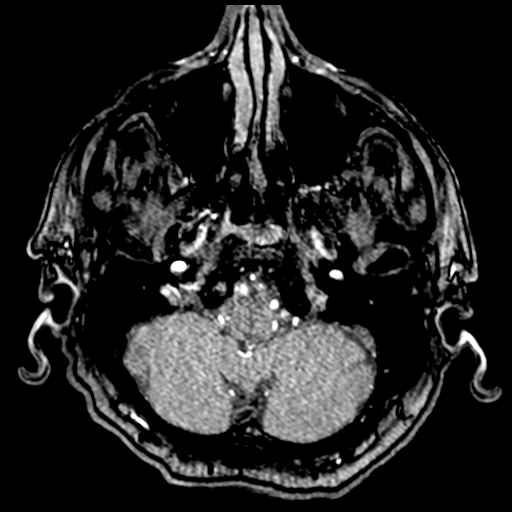
[im 4/172]
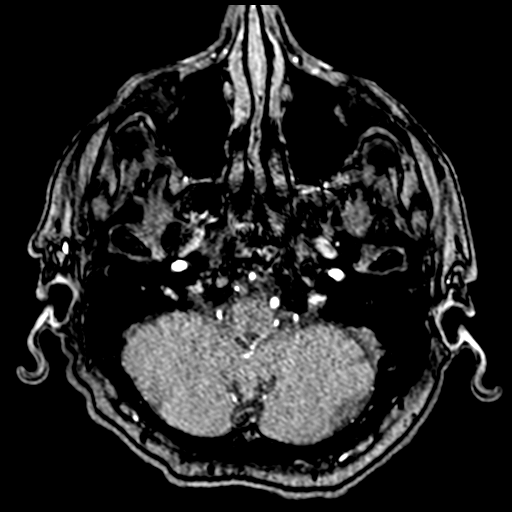
[im 8/172]
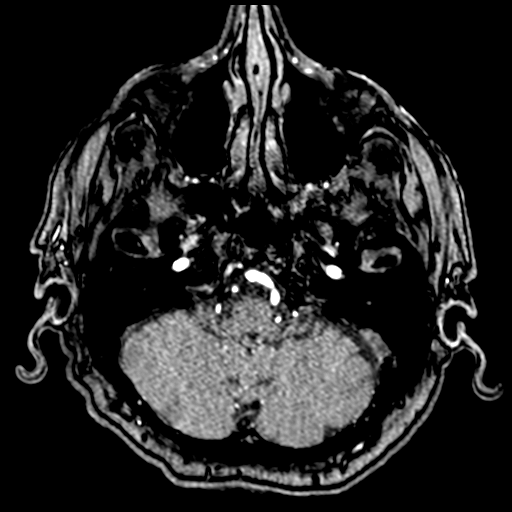
[im 11/172]
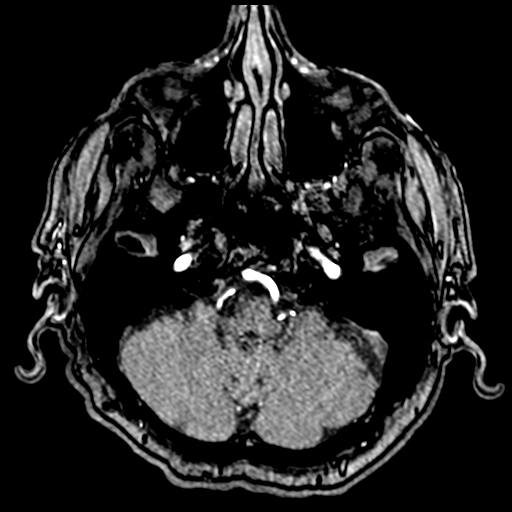
[im 15/172]
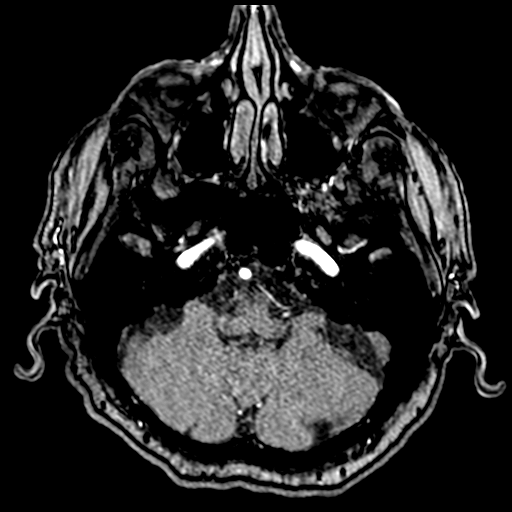
[im 19/172]
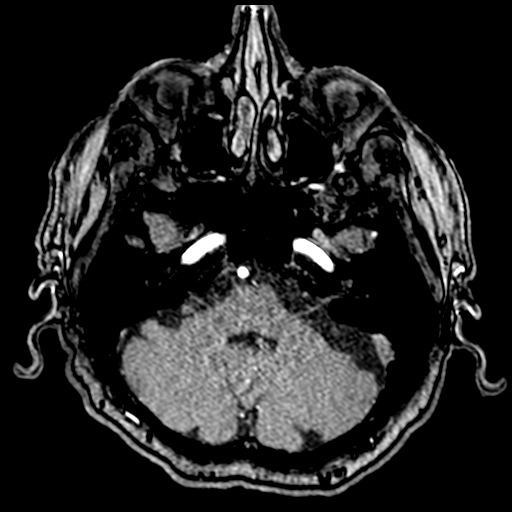
[im 22/172]
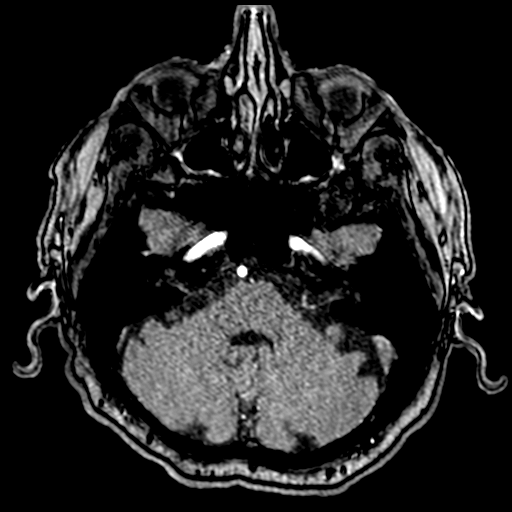
[im 26/172]
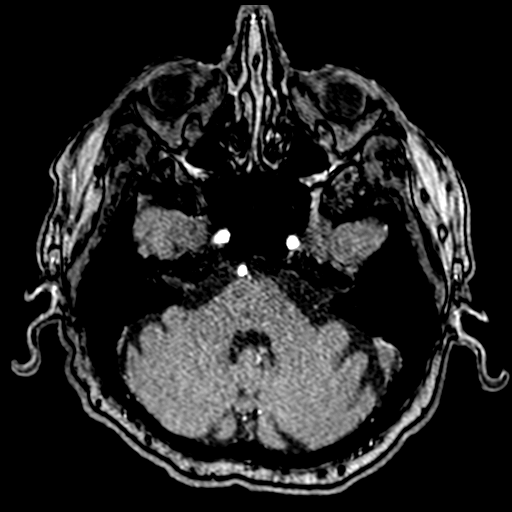
[im 30/172]
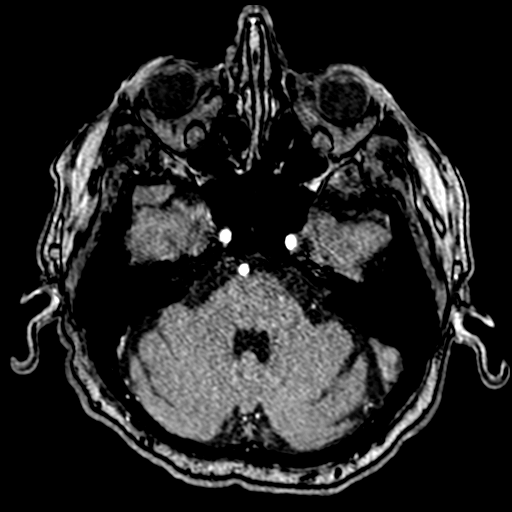
[im 33/172]
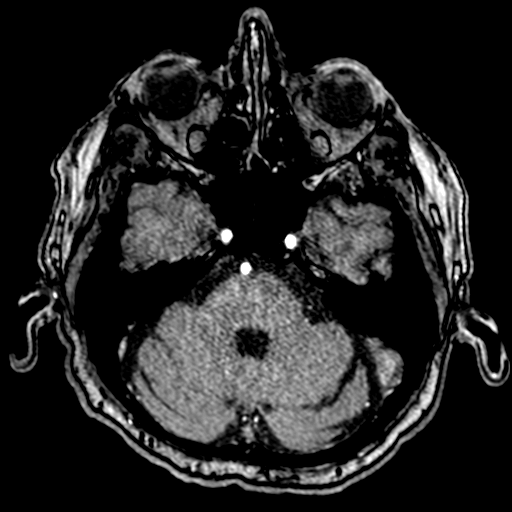
[im 55/172]
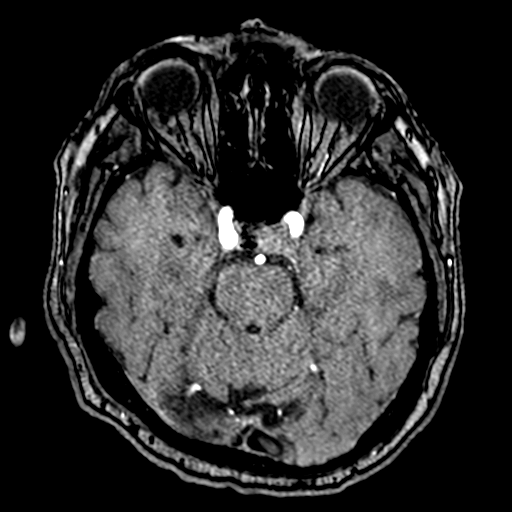
[im 77/172]
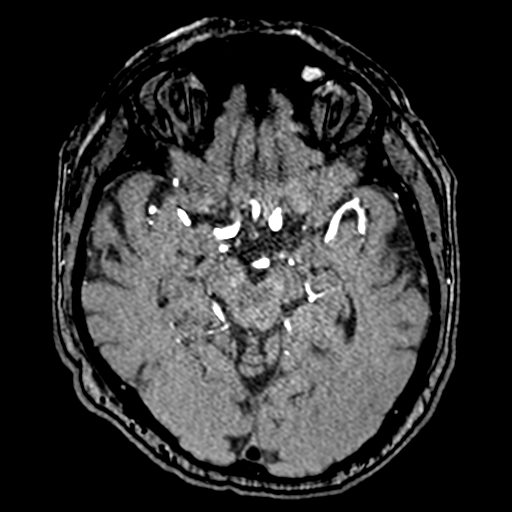
[im 88/172]
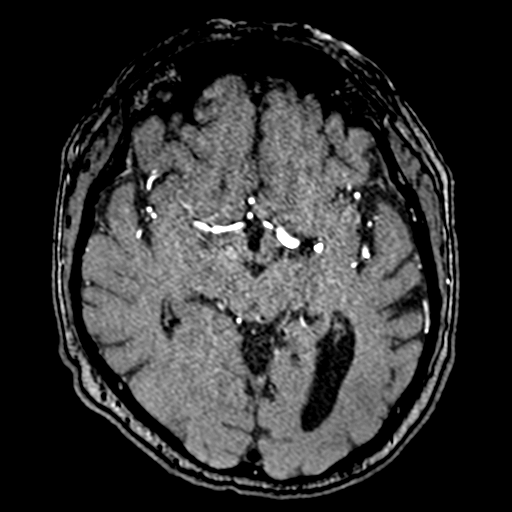
[im 99/172]
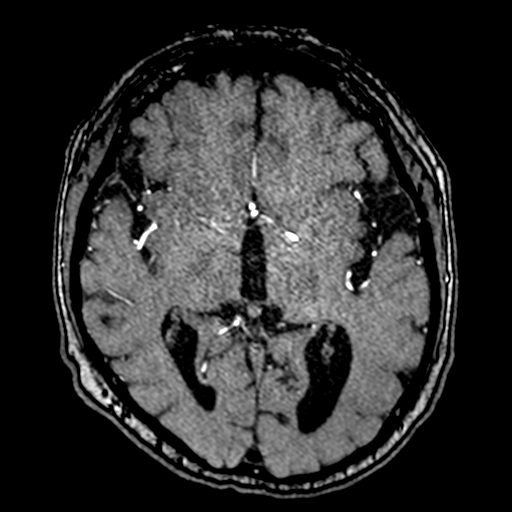
[im 121/172]
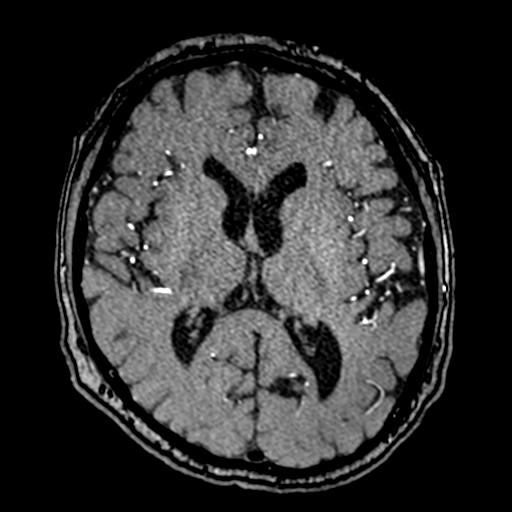
[im 142/172]
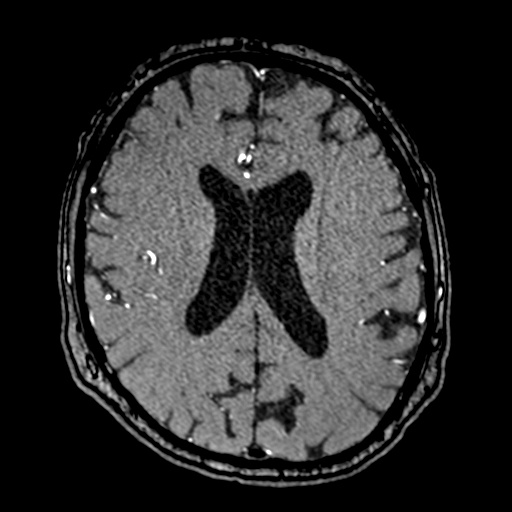
[im 146/172]
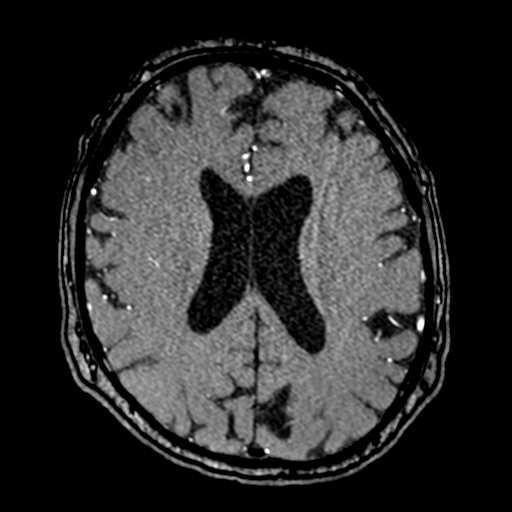
[im 164/172]
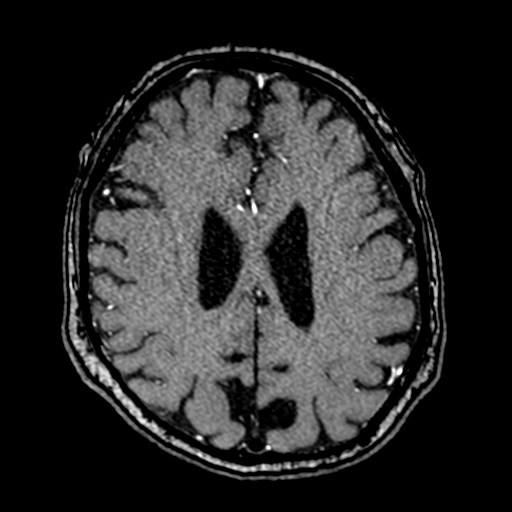

[18 of 48 positions shown; findings below may reference images not displayed]

FINDINGS: MRA HEAD FINDINGS

ANTERIOR CIRCULATION:

Visualized distal cervical segments of the internal carotid arteries
are widely patent with antegrade flow. Petrous, cavernous, and
supraclinoid segments patent without stenosis or other abnormality.
A1 segments patent bilaterally. Normal anterior communicating artery
complex. Anterior cerebral arteries patent to their distal aspects
without stenosis. No M1 stenosis or occlusion. Normal MCA
bifurcations. Distal MCA branches well perfused and symmetric.

POSTERIOR CIRCULATION:

The visualized portions of the vertebral arteries patent without
stenosis. Left vertebral artery dominant. Neither PICA origin
visualized. Basilar patent to its distal aspect without stenosis.
Superior cerebellar arteries patent bilaterally. Right PCA supplied
via the basilar. Left PCA supplied via the basilar as well as a
prominent left posterior communicating artery. Both PCAs well
perfused or distal aspects without stenosis.

No intracranial aneurysm.

MRA NECK FINDINGS

AORTIC ARCH: Visualized aortic arch normal in caliber with normal
branch pattern. No definite stenosis about the origin of the great
vessels, although evaluation somewhat limited due to lack of IV
contrast.

RIGHT CAROTID SYSTEM: Visualized right CCA widely patent to the
bifurcation without stenosis. No significant atheromatous narrowing
about the right carotid bulb. Right ICA patent distally without
stenosis, evidence for dissection or occlusion.

LEFT CAROTID SYSTEM: Visualized left CCA patent to the bifurcation
without stenosis. No significant atheromatous narrowing or
irregularity about the left carotid bulb. Left ICA patent distally
without stenosis, evidence for dissection or occlusion.

VERTEBRAL ARTERIES: Both vertebral arteries arise from the
subclavian arteries. Left vertebral artery slightly dominant.
Vertebral arteries patent within the neck without stenosis, evidence
for dissection or occlusion.
IMPRESSION: 1. Normal intracranial MRA. No large vessel occlusion,
hemodynamically significant stenosis, or other vascular abnormality.
No aneurysm.
2. Normal MRA of the neck. No hemodynamically significant stenosis
within the neck.

## 2021-03-08 MED ORDER — CLOPIDOGREL BISULFATE 75 MG PO TABS: 75.0000 mg | ORAL_TABLET | Freq: Every day | ORAL | 0 refills | Status: AC

## 2021-03-08 MED ORDER — ASPIRIN 81 MG PO TBEC: 81.0000 mg | DELAYED_RELEASE_TABLET | Freq: Every day | ORAL | 11 refills | Status: AC

## 2021-03-08 MED ORDER — ATORVASTATIN CALCIUM 20 MG PO TABS: 20.0000 mg | ORAL_TABLET | Freq: Every day | ORAL | 2 refills | Status: DC

## 2021-03-08 MED ORDER — PERFLUTREN LIPID MICROSPHERE
1.0000 mL | INTRAVENOUS | Status: AC | PRN
  Administered 2021-03-08: 2 mL via INTRAVENOUS
  Filled 2021-03-08: qty 10

## 2021-03-08 NOTE — Progress Notes (Signed)
STROKE TEAM PROGRESS NOTE   INTERVAL HISTORY  70 y.o. male  with a remote history of prostate cancer, cataracts, pituitary adenoma, presenting to emergency department with a single episode of double vision 2 days ago while he was driving. This lasted for approximately 1-2 minutes.  He felt the two lane road became a four lane road.  This is his first time experiencing double vision.  For barely a minute or less he had no associated symptoms.  He states that he has blurry vision from a recent cataract diagnosis in which he is planning on having surgery in the near future. Denies any prior history of strokes or TIAs.  He denies any history suggestive of palpitations, syncope or any cardiac complaints Afebrile, vital signs stable.  Neuro exam intact.    Vitals:   03/08/21 0933 03/08/21 1000 03/08/21 1154 03/08/21 1210  BP: 121/76 113/79  134/90  Pulse: 68 62    Resp: 13 15    Temp:   98.1 F (36.7 C)   TempSrc:   Oral   SpO2: 97% 96%    Weight:      Height:       CBC:  Recent Labs  Lab 03/07/21 1210 03/08/21 0247  WBC 5.2 6.8  HGB 15.0 14.3  HCT 44.0 42.1  MCV 90.3 91.9  PLT 135* 749*   Basic Metabolic Panel:  Recent Labs  Lab 03/07/21 1233 03/08/21 0247  NA 139 138  K 4.2 4.1  CL 106 107  CO2 25 25  GLUCOSE 101* 98  BUN 16 11  CREATININE 0.83 0.94  CALCIUM 9.0 9.0   Lipid Panel: No results for input(s): CHOL, TRIG, HDL, CHOLHDL, VLDL, LDLCALC in the last 168 hours. HgbA1c: No results for input(s): HGBA1C in the last 168 hours. Urine Drug Screen:  Recent Labs  Lab 03/08/21 0839  LABOPIA NONE DETECTED  COCAINSCRNUR NONE DETECTED  LABBENZ NONE DETECTED  AMPHETMU NONE DETECTED  THCU NONE DETECTED  LABBARB NONE DETECTED    Alcohol Level No results for input(s): ETH in the last 168 hours.  IMAGING  MR ANGIO HEAD WO CONTRAST Result Date: 03/08/2021 IMPRESSION: 1. Normal intracranial MRA. No large vessel occlusion, hemodynamically significant stenosis, or other  vascular abnormality. No aneurysm. 2. Normal MRA of the neck. No hemodynamically significant stenosis within the neck.  MR ANGIO NECK WO CONTRAST Result Date: 03/08/2021 IMPRESSION: 1. Normal intracranial MRA. No large vessel occlusion, hemodynamically significant stenosis, or other vascular abnormality. No aneurysm. 2. Normal MRA of the neck. No hemodynamically significant stenosis within the neck. Electronically Signed   By: Jeannine Boga M.D.   On: 03/08/2021 04:18   MR Brain W and Wo Contrast Result Date: 03/07/2021 IMPRESSION: Age related volume loss and mild chronic small-vessel change of the cerebral hemispheric white matter. Acute punctate infarction at the right posterior parietal cortex as outlined above, consistent with a single micro embolic infarction.   echo IMPRESSIONS  Result Date: 03/08/2021 1. Left ventricular ejection fraction, by estimation, is 65 to 70%. The  left ventricle has normal function. The left ventricle has no regional  wall motion abnormalities. There is mild asymmetric left ventricular  hypertrophy of the basal-septal segment.  Left ventricular diastolic parameters were normal.  2. Right ventricular systolic function is normal. The right ventricular  size is normal. Tricuspid regurgitation signal is inadequate for assessing  PA pressure.  3. The mitral valve is grossly normal. No evidence of mitral valve  regurgitation. No evidence of mitral stenosis.  4. The aortic valve is tricuspid. Aortic valve regurgitation is not  visualized. No aortic stenosis is present.   Conclusion(s)/Recommendation(s): No intracardiac source of embolism  detected on this transthoracic study. A transesophageal echocardiogram is  recommended to exclude cardiac source of embolism if clinically indicated.   PHYSICAL EXAM Constitutional: Appears well-developed and well-nourished.  Psych: Affect appropriate to situation,   Eyes: No scleral injection HENT: No  oropharyngeal obstruction.  MSK: no joint deformities.  Cardiovascular: Normal rate and regular rhythm.  Respiratory: Effort normal, non-labored breathing GI: Soft.  No distension. There is no tenderness.  Skin: Warm dry and intact visible skin  Neuro: Mental Status: Patient is awake, alert, oriented to person, place, month, year, and situation.  Patient is able to give a clear and coherent history.  No signs of aphasia or neglect   Cranial Nerves: II: Visual Fields are full. Pupils are equal, round, and reactive to light.    III,IV, VI: EOMI without ptosis or diploplia.  V: Facial sensation is symmetric to temperature VII: Facial movement is symmetric.  VIII: hearing is intact to voice X: Uvula elevates symmetrically XI: Shoulder shrug is symmetric. XII: tongue is midline without atrophy or fasciculations.  Motor: Tone is normal. Bulk is normal. 5/5 strength was present in all four extremities.   Sensory: Sensation is symmetric to light touch and temperature in the arms and legs.   Cerebellar: FNF and HKS are intact bilaterally   ASSESSMENT/PLAN [name] is a 70 y.o. male with history of prostate cancer presents with brief episode of diplopia lasting less than one minute.    Transient Diplopia of unclear etiology.  Doubt related to the MRI abnormality  Patient experienced 1 minute of diplopia 2 days prior to admission.  No longer experiencing diplopia.  Patient has no symptoms or signs of stroke :    MRI  Brain: acute punctate infarction at the right posterior parietal cortex, does not appear to be a true CVA  MRA: no LVO, no significant stenosis  2D Echo EF 60-65%, no thrombus  Sars coronavirus negative  LDL No results found for requested labs within last 26280 hours.  HgbA1c No results found for requested labs within last 26280 hours.  VTE prophylaxis - lovenox Diet: heart health  No antithrombotic prior to admission, now on aspirin 81 mg daily  and clopidogrel 75 mg daily. DAPT for 3 weeks then aspirin alone  Therapy recommendations:  No PT followup  Disposition:  Home  Follow-up in Neurology clinic in 4-6 weeks  Hypertension  Home meds: none  Stable . Permissive hypertension (OK if < 220/120) but gradually normalize in 5-7 days . Long-term BP goal normotensive  Hyperlipidemia  Home meds:  none  LDL No results found for requested labs within last 26280 hours., goal < 70  Add statin if not meeting goal   Continue statin at discharge  Diabetes type II Controlled  Home meds:  none  HgbA1c No results found for requested labs within last 26280 hours., goal < 7.0  CBGs  SSI  Other Stroke Risk Factors  Advanced Age >/= 11   Obesity, Body mass index is 31.25 kg/m., recommend weight loss, diet and exercise as appropriate   Other Active Problems  Prostate cancer (2019) s/p radiation therapy  Pituitary adenoma s/p gamma knife surgery at [facility] day # [place], ACNP-BC Stroke NP I have personally obtained history,examined this patient, reviewed notes, independently viewed imaging studies, participated in medical  decision making and plan of care.ROS completed by me personally and pertinent positives fully documented  I have made any additions or clarifications directly to the above note. Agree with note above.  Patient presented with transient 1 minute episode of vertical diplopia with some blurred vision while driving which is of unclear etiology and not likely related to the MRI abnormality which may be a silent right parieto-occipital infarct versus artifact.  Agree with aspirin Plavix for 3 weeks followed by aspirin alone and risk factor modification.  Recommend outpatient cardiac monitoring for 4 weeks for paroxysmal A. fib after discharge.  Follow-up in the stroke clinic in 6 weeks.  Follow[facility] Angelene [facility] at [facility] for his pituitary microadenoma.  Greater than  50% time during this 35-minute visit was spent on counseling and coordination of care and discussion with care team.  Discussed with Dr. Ree Kida.  Antony Contras, MD Medical Director [place] Pager: [phone] 03/08/2021 4:51 PM  To contact Stroke Continuity provider, please refer to [email or web address] After hours, contact General Neurology

## 2021-03-08 NOTE — Care Management CC44 (Signed)
Condition Code 44 Documentation Completed  Patient Details  Name: Garrett Garcia MRN: [id number] Date of Birth: [birth year]   Condition Code 44 given:  Yes Patient signature on Condition Code 44 notice:  Yes Documentation of 2 MD's agreement:  Yes Code 44 added to claim:  Yes    Fuller Mandril, RN 03/08/2021, 12:44 PM

## 2021-03-08 NOTE — Evaluation (Signed)
Physical Therapy Evaluation and Discharge Patient Details Name: Garrett Garcia MRN: [id number] DOB: June 17, 1951 Today's Date: 03/08/2021   History of Present Illness  Pt is a 70 y/o male admitted on 5/25 secondary to double vision. Symptoms have since resolved. 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 PMH includes prostate cancer and pituitary tumor.  Clinical Impression  Patient evaluated by Physical Therapy with no further acute PT needs identified. All education has been completed and the patient has no further questions. Pt overall independent with mobility tasks. Was able to perform DGI tasks without LOB. Educated about "BE FAST" acronym in recognizing CVA symptoms. See below for any follow-up Physical Therapy or equipment needs. PT is signing off. Thank you for this referral. If needs change, please re-consult.      Follow Up Recommendations No PT follow up    Equipment Recommendations  None recommended by PT    Recommendations for Other Services       Precautions / Restrictions Precautions Precautions: None Restrictions Weight Bearing Restrictions: No      Mobility  Bed Mobility Overal bed mobility: Independent                  Transfers Overall transfer level: Independent                  Ambulation/Gait Ambulation/Gait assistance: Independent Gait Distance (Feet): 125 Feet Assistive device: None Gait Pattern/deviations: WFL(Within Functional Limits) Gait velocity: WFL   General Gait Details: Able to perform DGI tasks without LOB this session. Good gait speed.  Stairs            Wheelchair Mobility    Modified Rankin (Stroke Patients Only) Modified Rankin (Stroke Patients Only) Pre-Morbid Rankin Score: No symptoms Modified Rankin: No symptoms     Balance Overall balance assessment: Independent                               Standardized Balance Assessment Standardized Balance Assessment : Dynamic Gait  Index   Dynamic Gait Index Level Surface: Normal Change in Gait Speed: Normal Gait with Horizontal Head Turns: Normal Gait with Vertical Head Turns: Normal Gait and Pivot Turn: Normal Step Over Obstacle: Normal Step Around Obstacles: Normal       Pertinent Vitals/Pain Pain Assessment: No/denies pain    Home Living Family/patient expects to be discharged to:: Private residence Living Arrangements: Spouse/significant other Available Help at Discharge: Family Type of Home: Other(Comment) (townhouse) Home Access: Stairs to enter Entrance Stairs-Rails: Right;Left;Can reach both Entrance Stairs-Number of Steps: 3 Home Layout: Two level;Able to live on main level with bedroom/bathroom Home Equipment: Kasandra Knudsen - single point;Shower seat      Prior Function Level of Independence: Independent               Hand Dominance   Dominant Hand: Right    Extremity/Trunk Assessment   Upper Extremity Assessment Upper Extremity Assessment: Overall WFL for tasks assessed    Lower Extremity Assessment Lower Extremity Assessment: Overall WFL for tasks assessed    Cervical / Trunk Assessment Cervical / Trunk Assessment: Normal  Communication   Communication: No difficulties  Cognition Arousal/Alertness: Awake/alert Behavior During Therapy: WFL for tasks assessed/performed Overall Cognitive Status: Within Functional Limits for tasks assessed  General Comments General comments (skin integrity, edema, etc.): Educated about "BE FAST" acronym when recognizing CVA symptoms.    Exercises     Assessment/Plan    PT Assessment Patent does not need any further PT services  PT Problem List         PT Treatment Interventions      PT Goals (Current goals can be found in the Care Plan section)  Acute Rehab PT Goals Patient Stated Goal: to go home PT Goal Formulation: With patient Time For Goal Achievement: 03/08/21 Potential  to Achieve Goals: Good    Frequency     Barriers to discharge        Co-evaluation               AM-PAC PT "6 Clicks" Mobility  Outcome Measure Help needed turning from your back to your side while in a flat bed without using bedrails?: None Help needed moving from lying on your back to sitting on the side of a flat bed without using bedrails?: None Help needed moving to and from a bed to a chair (including a wheelchair)?: None Help needed standing up from a chair using your arms (e.g., wheelchair or bedside chair)?: None Help needed to walk in hospital room?: None Help needed climbing 3-5 steps with a railing? : None 6 Click Score: 24    End of Session   Activity Tolerance: Patient tolerated treatment well Patient left: in bed;with call bell/phone within reach (in bed in ED) Nurse Communication: Mobility status PT Visit Diagnosis: Other symptoms and signs involving the nervous system (L57.262)    Time: 1055-1105 PT Time Calculation (min) (ACUTE ONLY): 10 min   Charges:   PT Evaluation $PT Eval Low Complexity: 1 Low          [name], [name]  Acute Rehabilitation Services  Pager: [phone] Office: [phone]   [name] 03/08/2021, 11:36 AM

## 2021-03-08 NOTE — Discharge Summary (Signed)
Physician Discharge Summary  Jcion Buddenhagen [facility] GGY:[id number] DOB: October 23, 1950 DOA: 03/07/2021  PCP: Sarina Ser, MD  Admit date: 03/07/2021 Discharge date: 03/08/2021  Time spent: 45 minutes  Recommendations for Outpatient Follow-up:  Patient will be discharged to home.  Patient will need to follow up with primary care provider within one week of discharge.  Patient should continue medications as prescribed. Follow up with neurology. Patient should follow a heart healthy diet.   Discharge Diagnoses:  Transient diplopia with incidental/silent CVA Hyperlipidemia History of prostate cancer and pituitary adenoma Transient hypertension  Discharge Condition: Stable  Diet recommendation: heart healthy  Filed Weights   03/07/21 1114  Weight: 113.4 kg    History of present illness:  On 03/07/2021 by Dr. Irena Reichmann is a 70 y.o. 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  This episode occurred 2 days ago while driving.  Lasted approximately 60 seconds.  No focal deficits.  No slurred speech.  No facial droop.  He denies any previous history of TIA or acute CVA.  He has already been evaluated by the neurology team in the emergency department.  MRI shows a punctuate stroke at the right posterior parietal cortex.  He is hemodynamically stable.  EKG shows no arrhythmia.  Upon my evaluation NIH stroke scale is 0.  He is resting comfortably in bed and has no other acute complaints.  Patient eagerly awaiting his 49th wedding anniversary reception in [place] in 3 weeks.  Hospital Course:  Transient diplopia with incidental/silent CVA -Patient presented with an episode of transient diplopia which happened 2 days prior to hospitalization and lasted approximately 1 minute.  Upon admission, patient  had no symptoms or signs of stroke. -MRI did show punctate stroke at the right posterior parietal cortex -MRA head and neck was noted to be normal, no large vessel occlusion -Echocardiogram showed an EF of 65 to 70%, no regional motion abnormalities.  LV diastolic parameters normal.  Mild left ventricular hypertrophy -Patient was started on aspirin and Plavix initially -PT evaluated patient, no follow-up needed  -LDL 102, hemoglobin A1c pending -Neurology consulted and appreciated.  Discussed with Dr. Leonie Man, feels this is likely diplopia is not due to MRI finding however he would like to treat this as a silent CVA. Recommended aspirin, Plavix for 3 weeks followed by aspirin alone.  Recommended starting statin and a 30-day heart monitor.  Patient can follow-up with Dr. Leonie Man in 4 to 6 weeks.  Hyperlipidemia -Lipid panel: total cholesterol 163, HDL 47, LDL 102, triglycerides 71 -Will start patient on statin- patient wishes to start on a low dose -Recommend the patient have repeat LFTs as well as lipid panel in 3 months  History of prostate cancer and pituitary adenoma -Continue outpatient follow-up  Transient hypertension -Upon admission, patient was noted to have a blood pressure of 164/100 -BP has now been stable -Patient was given amlodipine however would not continue this on discharge -Patient should follow-up with his PCP for close monitoring  Procedures:  Echocardiogram  Consultations:  Neurology  Discharge Exam: Vitals:   03/08/21 1210 03/08/21 1400  BP: 134/90 132/86  Pulse:  85  Resp:  16  Temp:    SpO2:  97%     General: Well developed, well nourished, NAD, appears stated age  HEENT: NCAT, mucous membranes moist.  Cardiovascular: S1  S2 auscultated,RRR  Respiratory: Clear to auscultation bilaterally with equal chest rise  Abdomen: Soft, nontender, nondistended, + bowel sounds  Extremities: warm dry without cyanosis clubbing or edema  Neuro: AAOx3,  nonfocal  Skin: Without rashes exudates or nodules  Psych: Normal affect and demeanor with intact judgement and insight  Discharge Instructions Discharge Instructions    Diet - low sodium heart healthy   Complete by: As directed    Discharge instructions   Complete by: As directed    Patient will be discharged to home.  Patient will need to follow up with primary care provider within one week of discharge.  Patient should continue medications as prescribed.  Patient should follow a regular diet.   Discharge instructions   Complete by: As directed    Patient will be discharged to home.  Patient will need to follow up with primary care provider within one week of discharge.  Patient should continue medications as prescribed. Follow up with neurology. Patient should follow a heart healthy diet.   Increase activity slowly   Complete by: As directed    Increase activity slowly   Complete by: As directed      Allergies as of 03/08/2021   No Known Allergies     Medication List    TAKE these medications   aspirin 81 MG EC tablet Take 1 tablet (81 mg total) by mouth daily. Swallow whole. Start taking on: Mar 09, 2021   atorvastatin 20 MG tablet Commonly known as: Lipitor Take 1 tablet (20 mg total) by mouth daily.   clopidogrel 75 MG tablet Commonly known as: Plavix Take 1 tablet (75 mg total) by mouth daily for 21 days.   Multivitamin Adults 50+ Tabs Take 1 tablet by mouth daily.   TART CHERRY PO Take 8 fluid ounces by mouth daily.      No Known Allergies  Follow-up Information    Sarina Ser, MD. Schedule an appointment as soon as possible for a visit in 1 week(s).   Specialty: Internal Medicine Why: Hospital follow-up Contact information: [address] [place] [address] [phone]        [facility] Follow up in 4 week(s).   Specialty: Neurology Contact information: [place] [facility].   Why: Call to set up your heart monitor. Contact information: [place] [address] [phone]               The results of significant diagnostics from this hospitalization (including imaging, microbiology, ancillary and laboratory) are listed below for reference.    Significant Diagnostic Studies: MR ANGIO HEAD WO CONTRAST  Result Date: 03/08/2021 CLINICAL DATA:  Follow-up examination for acute stroke. EXAM: MRA HEAD WITHOUT CONTRAST MRA NECK WITHOUT CONTRAST TECHNIQUE: Angiographic images of the Circle of Willis were obtained using MRA technique without intravenous contrast. Angiographic images of the neck were obtained using MRA technique without intravenous contrast. Carotid stenosis measurements (when applicable) are obtained utilizing NASCET criteria, using the distal internal carotid diameter as the denominator. COMPARISON:  Previous MRI from 03/07/2021 FINDINGS: MRA HEAD FINDINGS ANTERIOR CIRCULATION: Visualized distal cervical segments of the internal carotid arteries are widely patent with antegrade flow. Petrous, cavernous, and supraclinoid segments patent without stenosis or other abnormality. A1 segments patent bilaterally. Normal anterior communicating artery complex. Anterior cerebral arteries patent to their distal aspects without stenosis. No  M1 stenosis or occlusion. Normal MCA bifurcations. Distal MCA branches well perfused and symmetric. POSTERIOR CIRCULATION: The visualized portions of the vertebral arteries patent without stenosis. Left vertebral artery dominant. Neither PICA origin visualized. Basilar patent to its distal aspect without stenosis. Superior cerebellar arteries patent bilaterally. Right PCA supplied via the basilar. Left PCA supplied via the basilar as well as a prominent left posterior communicating artery. Both PCAs  well perfused or distal aspects without stenosis. No intracranial aneurysm. MRA NECK FINDINGS AORTIC ARCH: Visualized aortic arch normal in caliber with normal branch pattern. No definite stenosis about the origin of the great vessels, although evaluation somewhat limited due to lack of IV contrast. RIGHT CAROTID SYSTEM: Visualized right CCA widely patent to the bifurcation without stenosis. No significant atheromatous narrowing about the right carotid bulb. Right ICA patent distally without stenosis, evidence for dissection or occlusion. LEFT CAROTID SYSTEM: Visualized left CCA patent to the bifurcation without stenosis. No significant atheromatous narrowing or irregularity about the left carotid bulb. Left ICA patent distally without stenosis, evidence for dissection or occlusion. VERTEBRAL ARTERIES: Both vertebral arteries arise from the subclavian arteries. Left vertebral artery slightly dominant. Vertebral arteries patent within the neck without stenosis, evidence for dissection or occlusion. IMPRESSION: 1. Normal intracranial MRA. No large vessel occlusion, hemodynamically significant stenosis, or other vascular abnormality. No aneurysm. 2. Normal MRA of the neck. No hemodynamically significant stenosis within the neck. Electronically Signed   By: Jeannine Boga M.D.   On: 03/08/2021 04:18   MR ANGIO NECK WO CONTRAST  Result Date: 03/08/2021 CLINICAL DATA:  Follow-up examination for acute stroke. EXAM: MRA HEAD WITHOUT CONTRAST MRA NECK WITHOUT CONTRAST TECHNIQUE: Angiographic images of the Circle of Willis were obtained using MRA technique without intravenous contrast. Angiographic images of the neck were obtained using MRA technique without intravenous contrast. Carotid stenosis measurements (when applicable) are obtained utilizing NASCET criteria, using the distal internal carotid diameter as the denominator. COMPARISON:  Previous MRI from 03/07/2021 FINDINGS: MRA HEAD FINDINGS ANTERIOR  CIRCULATION: Visualized distal cervical segments of the internal carotid arteries are widely patent with antegrade flow. Petrous, cavernous, and supraclinoid segments patent without stenosis or other abnormality. A1 segments patent bilaterally. Normal anterior communicating artery complex. Anterior cerebral arteries patent to their distal aspects without stenosis. No M1 stenosis or occlusion. Normal MCA bifurcations. Distal MCA branches well perfused and symmetric. POSTERIOR CIRCULATION: The visualized portions of the vertebral arteries patent without stenosis. Left vertebral artery dominant. Neither PICA origin visualized. Basilar patent to its distal aspect without stenosis. Superior cerebellar arteries patent bilaterally. Right PCA supplied via the basilar. Left PCA supplied via the basilar as well as a prominent left posterior communicating artery. Both PCAs well perfused or distal aspects without stenosis. No intracranial aneurysm. MRA NECK FINDINGS AORTIC ARCH: Visualized aortic arch normal in caliber with normal branch pattern. No definite stenosis about the origin of the great vessels, although evaluation somewhat limited due to lack of IV contrast. RIGHT CAROTID SYSTEM: Visualized right CCA widely patent to the bifurcation without stenosis. No significant atheromatous narrowing about the right carotid bulb. Right ICA patent distally without stenosis, evidence for dissection or occlusion. LEFT CAROTID SYSTEM: Visualized left CCA patent to the bifurcation without stenosis. No significant atheromatous narrowing or irregularity about the left carotid bulb. Left ICA patent distally without stenosis, evidence for dissection or occlusion. VERTEBRAL ARTERIES: Both vertebral arteries arise from the subclavian arteries. Left vertebral artery slightly dominant. Vertebral arteries patent within the neck without stenosis, evidence for dissection or  occlusion. IMPRESSION: 1. Normal intracranial MRA. No large vessel  occlusion, hemodynamically significant stenosis, or other vascular abnormality. No aneurysm. 2. Normal MRA of the neck. No hemodynamically significant stenosis within the neck. Electronically Signed   By: Jeannine Boga M.D.   On: 03/08/2021 04:18   MR Brain W and Wo Contrast  Result Date: 03/07/2021 CLINICAL DATA:  Episode of double vision 2 days ago which has resolved. History of pituitary adenoma. Transient ischemic attack. EXAM: MRI HEAD WITHOUT AND WITH CONTRAST TECHNIQUE: Multiplanar, multiecho pulse sequences of the brain and surrounding structures were obtained without and with intravenous contrast. CONTRAST:  23mL GADAVIST GADOBUTROL 1 MMOL/ML IV SOLN COMPARISON:  None. FINDINGS: Brain: Diffusion imaging shows a punctate acute infarction in the right posterior parietal cortex, diffusion axial image 39 and diffusion coronal image 6. No other acute infarction. No abnormality seen affecting the brainstem or cerebellum. Cerebral hemispheres otherwise show mild chronic small-vessel ischemic change of the deep and subcortical white matter considering age. No large vessel territory infarction. No mass lesion, hemorrhage, hydrocephalus or extra-axial collection. After contrast administration, no abnormal enhancement occurs. Vascular: Major vessels at the base of the brain show flow. Skull and upper cervical spine: Negative Sinuses/Orbits: Clear/normal Other: None IMPRESSION: Age related volume loss and mild chronic small-vessel change of the cerebral hemispheric white matter. Acute punctate infarction at the right posterior parietal cortex as outlined above, consistent with a single micro embolic infarction. Electronically Signed   By: Nelson Chimes M.D.   On: 03/07/2021 15:49   ECHOCARDIOGRAM COMPLETE  Result Date: 03/08/2021    ECHOCARDIOGRAM REPORT   Patient Name:   Garrett Garcia Date of Exam: 03/08/2021 Medical Rec #:  [id number]         Height:       75.0 in Accession #:    [id number]         Weight:       250.0 lb Date of Birth:  [birth year]        BSA:          2.413 m Patient Age:    70 years          BP:           121/76 mmHg Patient Gender: M                 HR:           68 bpm. Exam Location:  Inpatient Procedure: 2D Echo, Cardiac Doppler, Color Doppler and Intracardiac            Opacification Agent Indications:    Stroke I63.9  History:        Patient has no prior history of Echocardiogram examinations.                 History of cancer.  Sonographer:    Darlina Sicilian RDCS Referring Phys: [place]  1. Left ventricular ejection fraction, by estimation, is 65 to 70%. The left ventricle has normal function. The left ventricle has no regional wall motion abnormalities. There is mild asymmetric left ventricular hypertrophy of the basal-septal segment. Left ventricular diastolic parameters were normal.  2. Right ventricular systolic function is normal. The right ventricular size is normal. Tricuspid regurgitation signal is inadequate for assessing PA pressure.  3. The mitral valve is grossly normal. No evidence of mitral valve regurgitation. No evidence of mitral stenosis.  4. The aortic valve is tricuspid. Aortic valve regurgitation is not visualized. No aortic  stenosis is present. Conclusion(s)/Recommendation(s): No intracardiac source of embolism detected on this transthoracic study. A transesophageal echocardiogram is recommended to exclude cardiac source of embolism if clinically indicated. FINDINGS  Left Ventricle: Left ventricular ejection fraction, by estimation, is 65 to 70%. The left ventricle has normal function. The left ventricle has no regional wall motion abnormalities. Definity contrast agent was given IV to delineate the left ventricular  endocardial borders. The left ventricular internal cavity size was normal in size. There is mild asymmetric left ventricular hypertrophy of the basal-septal segment. Left ventricular diastolic parameters were normal. Right  Ventricle: The right ventricular size is normal. No increase in right ventricular wall thickness. Right ventricular systolic function is normal. Tricuspid regurgitation signal is inadequate for assessing PA pressure. Left Atrium: Left atrial size was normal in size. Right Atrium: Right atrial size was normal in size. Pericardium: Trivial pericardial effusion is present. Presence of pericardial fat pad. Mitral Valve: The mitral valve is grossly normal. No evidence of mitral valve regurgitation. No evidence of mitral valve stenosis. Tricuspid Valve: The tricuspid valve is grossly normal. Tricuspid valve regurgitation is trivial. No evidence of tricuspid stenosis. Aortic Valve: The aortic valve is tricuspid. Aortic valve regurgitation is not visualized. No aortic stenosis is present. Pulmonic Valve: The pulmonic valve was grossly normal. Pulmonic valve regurgitation is not visualized. No evidence of pulmonic stenosis. Aorta: The aortic root and ascending aorta are structurally normal, with no evidence of dilitation. Venous: The inferior vena cava was not well visualized. IAS/Shunts: The atrial septum is grossly normal.  LEFT VENTRICLE PLAX 2D LVIDd:         3.80 cm  Diastology LVIDs:         2.60 cm  LV e' medial:    4.90 cm/s LV PW:         1.00 cm  LV E/e' medial:  15.0 LV IVS:        1.20 cm  LV e' lateral:   5.98 cm/s LVOT diam:     1.90 cm  LV E/e' lateral: 12.3 LV SV:         57 LV SV Index:   24 LVOT Area:     2.84 cm  RIGHT VENTRICLE RV S prime:     17.30 cm/s TAPSE (M-mode): 2.3 cm LEFT ATRIUM           Index LA diam:      3.90 cm 1.62 cm/m LA Vol (A4C): 38.0 ml 15.75 ml/m  AORTIC VALVE LVOT Vmax:   97.90 cm/s LVOT Vmean:  78.400 cm/s LVOT VTI:    0.201 m  AORTA Ao Root diam: 3.40 cm Ao Asc diam:  3.80 cm MITRAL VALVE MV Area (PHT): 2.80 cm    SHUNTS MV Decel Time: 271 msec    Systemic VTI:  0.20 m MV E velocity: 73.30 cm/s  Systemic Diam: 1.90 cm MV A velocity: 66.40 cm/s MV E/A ratio:  1.10 Eleonore Chiquito  MD Electronically signed by Eleonore Chiquito MD Signature Date/Time: 03/08/2021/10:45:44 AM    Final     Microbiology: Recent Results (from the past 240 hour(s))  Resp Panel by RT-PCR (Flu A&B, Covid) Nasopharyngeal Swab     Status: None   Collection Time: 03/07/21  5:10 PM   Specimen: Nasopharyngeal Swab; Nasopharyngeal(NP) swabs in vial transport medium  Result Value Ref Range Status   SARS Coronavirus 2 by RT PCR NEGATIVE NEGATIVE Final    Comment: (NOTE) SARS-CoV-2 target nucleic acids are NOT DETECTED.  The SARS-CoV-2 RNA  is generally detectable in upper respiratory specimens during the acute phase of infection. The lowest concentration of SARS-CoV-2 viral copies this assay can detect is 138 copies/mL. A negative result does not preclude SARS-Cov-2 infection and should not be used as the sole basis for treatment or other patient management decisions. A negative result may occur with  improper specimen collection/handling, submission of specimen other than nasopharyngeal swab, presence of viral mutation(s) within the areas targeted by this assay, and inadequate number of viral copies(<138 copies/mL). A negative result must be combined with clinical observations, patient history, and epidemiological information. The expected result is Negative.  Fact Sheet for Patients:  EntrepreneurPulse.com.au  Fact Sheet for Healthcare Providers:  IncredibleEmployment.be  This test is no t yet approved or cleared by the [place] FDA and  has been authorized for detection and/or diagnosis of SARS-CoV-2 by FDA under an Emergency Use Authorization (EUA). This EUA will remain  in effect (meaning this test can be used) for the duration of the COVID-19 declaration under Section 564(b)(1) of the Act, 21 U.S.C.section 360bbb-3(b)(1), unless the authorization is terminated  or revoked sooner.       Influenza A by PCR NEGATIVE NEGATIVE Final   Influenza B by  PCR NEGATIVE NEGATIVE Final    Comment: (NOTE) The Xpert Xpress SARS-CoV-2/FLU/RSV plus assay is intended as an aid in the diagnosis of influenza from Nasopharyngeal swab specimens and should not be used as a sole basis for treatment. Nasal washings and aspirates are unacceptable for Xpert Xpress SARS-CoV-2/FLU/RSV testing.  Fact Sheet for Patients: EntrepreneurPulse.com.au  Fact Sheet for Healthcare Providers: IncredibleEmployment.be  This test is not yet approved or cleared by the [place] FDA and has been authorized for detection and/or diagnosis of SARS-CoV-2 by FDA under an Emergency Use Authorization (EUA). This EUA will remain in effect (meaning this test can be used) for the duration of the COVID-19 declaration under Section 564(b)(1) of the Act, 21 U.S.C. section 360bbb-3(b)(1), unless the authorization is terminated or revoked.  Performed at [facility], [place] [address], [place], [place] [address]      Labs: Basic Metabolic Panel: Recent Labs  Lab 03/07/21 1233 03/08/21 0247  NA 139 138  K 4.2 4.1  CL 106 107  CO2 25 25  GLUCOSE 101* 98  BUN 16 11  CREATININE 0.83 0.94  CALCIUM 9.0 9.0   Liver Function Tests: Recent Labs  Lab 03/08/21 0247  AST 20  ALT 18  ALKPHOS 61  BILITOT 1.1  PROT 6.5  ALBUMIN 3.8   No results for input(s): LIPASE, AMYLASE in the last 168 hours. No results for input(s): AMMONIA in the last 168 hours. CBC: Recent Labs  Lab 03/07/21 1210 03/08/21 0247  WBC 5.2 6.8  HGB 15.0 14.3  HCT 44.0 42.1  MCV 90.3 91.9  PLT 135* 135*   Cardiac Enzymes: No results for input(s): CKTOTAL, CKMB, CKMBINDEX, TROPONINI in the last 168 hours. BNP: BNP (last 3 results) No results for input(s): BNP in the last 8760 hours.  ProBNP (last 3 results) No results for input(s): PROBNP in the last 8760 hours.  CBG: No results for input(s): GLUCAP in the last 168  hours.     Signed:  Cristal Ford  [facility] 03/08/2021, 2:26 PM

## 2021-03-08 NOTE — Progress Notes (Signed)
OT Cancellation Note  Patient Details Name: Garrett Garcia MRN: [id number] DOB: 01/22/1951   Cancelled Treatment:    Reason Eval/Treat Not Completed: OT screened, no needs identified, will sign off.  Spoke with PT who reports pt back to baseline.  [name], OTR/L Acute Rehabilitation Services Pager [phone] Office [phone]   [name] M 03/08/2021, 1:33 PM

## 2021-03-08 NOTE — Evaluation (Signed)
Speech Language Pathology Evaluation Patient Details Name: Garrett Garcia MRN: [id number] DOB: 05-29-1951 Today's Date: 03/08/2021 Time: 0923-3007 SLP Time Calculation (min) (ACUTE ONLY): 10.07 min  Problem List:  Patient Active Problem List   Diagnosis Date Noted  . Double vision 03/08/2021  . Acute CVA (cerebrovascular accident) ([place]) 03/07/2021  . Posterior subcapsular age-related cataract of both eyes 03/29/2020  . Prostate cancer ([place]) 07/29/2017  . BPH without obstruction/lower urinary tract symptoms 07/08/2017  . Pituitary adenoma ([place]) 09/19/2011  . 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 Prostate cancer [facility]) 2019   Treated with Radiation    Past Surgical History:  Past Surgical History:  Procedure Laterality Date  . gamma knife for pituitary adenoma     HPI:  Pt is a 70 y/o male admitted on 5/25 secondary to double vision. Symptoms have since resolved. 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 PMH includes prostate cancer and pituitary tumor.   Assessment / Plan / Recommendation Clinical Impression  Pt evaluated with the [facility] Mental Status Exam. He achieved a score of 27/30, considered WNL.  Speech was clear without dysarthria.  Expressive and receptive language were WNL.  Pragmatics normal.  No deficits identified; no SLP f/u needed. Our service will sign off.    SLP Assessment  SLP Recommendation/Assessment: Patient does not need any further Speech Lanaguage Pathology Services SLP Visit Diagnosis: Cognitive communication deficit (R41.841)    Follow Up Recommendations  None    Frequency and Duration   n/a        SLP Evaluation Cognition  Overall Cognitive Status: Within Functional Limits for tasks assessed Arousal/Alertness: Awake/alert Orientation Level: Oriented X4 Attention: Alternating Alternating Attention: Appears  intact Memory: Appears intact Awareness: Appears intact Problem Solving: Appears intact Executive Function: Reasoning Reasoning: Appears intact Safety/Judgment: Appears intact       Comprehension  Auditory Comprehension Overall Auditory Comprehension: Appears within functional limits for tasks assessed Reading Comprehension Reading Status: Within funtional limits    Expression Expression Primary Mode of Expression: Verbal Verbal Expression Overall Verbal Expression: Appears within functional limits for tasks assessed Written Expression Dominant Hand: Right Written Expression: Within Functional Limits   Oral / Motor  Oral Motor/Sensory Function Overall Oral Motor/Sensory Function: Within functional limits Motor Speech Overall Motor Speech: Appears within functional limits for tasks assessed   GO                    Garrett Garcia 03/08/2021, 1:56 PM Garrett Garcia L. Tivis Garcia, [place] Office number [phone] Pager [phone]

## 2021-03-08 NOTE — Care Management CC44 (Signed)
Condition Code 44 Documentation Completed  Patient Details  Name: [name] MRN: [id number] Date of Birth: 01-16-1951   Condition Code 44 given:  Yes Patient signature on Condition Code 44 notice:  Yes Documentation of 2 MD's agreement:  Yes Code 44 added to claim:  Yes    Fuller Mandril, RN 03/08/2021, 12:44 PM

## 2021-03-08 NOTE — Telephone Encounter (Signed)
    [facility] asked to order cardiac event monitor for CVA. Orders placed. Reading MD to be Dr. Gardiner Rhyme (DOD) as patient has not been seen by [facility] in the past.

## 2021-03-08 NOTE — Progress Notes (Signed)
  Echocardiogram 2D Echocardiogram with definity has been performed.  Darlina Sicilian M 03/08/2021, 9:54 AM

## 2021-04-02 ENCOUNTER — Ambulatory Visit (INDEPENDENT_AMBULATORY_CARE_PROVIDER_SITE_OTHER): Payer: Medicare HMO

## 2021-04-02 DIAGNOSIS — I639 Cerebral infarction, unspecified: Secondary | ICD-10-CM

## 2021-04-24 ENCOUNTER — Telehealth: Payer: Self-pay | Admitting: Cardiology

## 2021-04-24 NOTE — Telephone Encounter (Signed)
Attempted to contact pt. Unable to leave message as phone line continues to ring.

## 2021-04-24 NOTE — Telephone Encounter (Signed)
Patient was mentioning that he still have a 30-day monitor from Dr. Gardiner Rhyme and is still wearing it until the end of July. Wanted to know if Dr. Gardiner Rhyme had an appt around the 1st or 2nd week of august to go over results.

## 2021-05-28 NOTE — Progress Notes (Signed)
Cardiology Office Note:    Date:  06/01/2021   ID:  Garrett Garcia, DOB Jul 28, 1951, MRN [id number]  PCP:  Jonathon Bellows, PA-C  Cardiologist:  None  Electrophysiologist:  None   Referring MD: Sarina Ser, MD   Chief Complaint  Patient presents with   Cerebrovascular Accident     History of Present Illness:    Garrett Garcia is a 70 y.o. male with a hx of prostate cancer, pituitary adenoma status post gamma knife surgery at [facility], CVA who presents for an initial visit.  He was admitted to [facility] 02/2021 with double vision.  MRI showed punctate stroke at the right posterior parietal cortex.  Echocardiogram showed EF 65 to 70%, normal RV function, normal diastolic function, mild asymmetric hypertrophy of LV basal septum, no significant valvular disease.  Neurology was consulted and he was started on aspirin/Plavix x3 weeks with plan for aspirin alone moving forward.  30-day monitor showed no significant abnormalities.  Since ED visit, he reports he has been doing well.  Reports no further vision issues.  Denies any chest pain, dyspnea, lightheadedness, syncope, lower extremity edema, or palpitations.  Reports he walks 5 days/week for 20 to 30 minutes and golfs once per week.  Denies any exertional symptoms.  Reports BP has been 110s to 120s over 70s at home.    Past Medical History:  Diagnosis Date   Prostate cancer ([place]) 2019   Treated with Radiation     Past Surgical History:  Procedure Laterality Date   gamma knife for pituitary adenoma      Current Medications: Current Meds  Medication Sig   aspirin EC 81 MG EC tablet Take 1 tablet (81 mg total) by mouth daily. Swallow whole.   atorvastatin (LIPITOR) 20 MG tablet Take 1 tablet (20 mg total) by mouth daily.   Coenzyme Q10 50 MG CAPS Take by mouth daily.   lisinopril (ZESTRIL) 2.5 MG tablet Take 2.5 mg by mouth daily.   Multiple Vitamins-Minerals (MULTIVITAMIN ADULTS 50+) TABS Take 1 tablet by mouth  daily.   sildenafil (REVATIO) 20 MG tablet as needed.   Tart Cherry 1200 MG CAPS Take by mouth daily.   [DISCONTINUED] TART CHERRY PO Take 8 fluid ounces by mouth daily.     Allergies:   Patient has no known allergies.   Social History   Socioeconomic History   Marital status: Married    Spouse name: Not on file   Number of children: Not on file   Years of education: Not on file   Highest education level: Not on file  Occupational History   Not on file  Tobacco Use   Smoking status: Former    Types: Cigarettes    Quit date: 59    Years since quitting: 50.6   Smokeless tobacco: Never  Substance and Sexual Activity   Alcohol use: Never   Drug use: Never   Sexual activity: Not on file  Other Topics Concern   Not on file  Social History Narrative   Lives at home with wife.  No assistance with ambulation.  Independent of ADLs.   Social Determinants of Health   Financial Resource Strain: Not on file  Food Insecurity: Not on file  Transportation Needs: Not on file  Physical Activity: Not on file  Stress: Not on file  Social Connections: Not on file     Family History: The patient's family history includes CAD in his father.  ROS:   Please see the history  of present illness.     All other systems reviewed and are negative.  EKGs/Labs/Other Studies Reviewed:    The following studies were reviewed today:   EKG:  EKG is  ordered today.  The ekg ordered today demonstrates NSR, rate 74, nonspecific T wave flattening  Recent Labs: 03/08/2021: ALT 18; BUN 11; Creatinine, Ser 0.94; Hemoglobin 14.3; Platelets 135; Potassium 4.1; Sodium 138  Recent Lipid Panel    Component Value Date/Time   CHOL 163 03/08/2021 0247   TRIG 71 03/08/2021 0247   HDL 47 03/08/2021 0247   CHOLHDL 3.5 03/08/2021 0247   VLDL 14 03/08/2021 0247   LDLCALC 102 (H) 03/08/2021 0247    Physical Exam:    VS:  BP 130/74   Pulse 74   Ht '6\' 3"'$  (1.905 m)   Wt 251 lb (113.9 kg)   SpO2 98%    BMI 31.37 kg/m     Wt Readings from Last 3 Encounters:  06/01/21 251 lb (113.9 kg)  03/07/21 250 lb (113.4 kg)     GEN:  Well nourished, well developed in no acute distress HEENT: Normal NECK: No JVD; No carotid bruits LYMPHATICS: No lymphadenopathy CARDIAC: RRR, no murmurs, rubs, gallops RESPIRATORY:  Clear to auscultation without rales, wheezing or rhonchi  ABDOMEN: Soft, non-tender, non-distended MUSCULOSKELETAL:  No edema; No deformity  SKIN: Warm and dry NEUROLOGIC:  Alert and oriented x 3 PSYCHIATRIC:  Normal affect   ASSESSMENT:    1. Cerebrovascular accident (CVA), unspecified mechanism ([place])   2. LVH (left ventricular hypertrophy)   3. Hyperlipidemia, unspecified hyperlipidemia type   4. Pre-procedure lab exam    PLAN:     CVA: Continue aspirin, statin.  30-day monitor shows no significant abnormalities.  Follows with neurology at [facility].  We will check limited echo with bubble study.  LVH: Moderate asymmetric LVH of basal septum on echo by my read.  Differential includes hypertensive heart disease versus HCM versus amyloid, will evaluate further with cardiac MRI  Hyperlipidemia: LDL 102 02/2021.  Continue atorvastatin 20 mg daily.  Will recheck lipid panel  Hypertension: started lisinopril 5 mg daily.  Felt lightheaded, this was reduced to 2.5 mg daily.  Appears controlled  RTC in 6 months   Medication Adjustments/Labs and Tests Ordered: Current medicines are reviewed at length with the patient today.  Concerns regarding medicines are outlined above.  Orders Placed This Encounter  Procedures   MR CARDIAC MORPHOLOGY W WO CONTRAST   CBC   Basic metabolic panel   Lipid panel   EKG 12-Lead   ECHOCARDIOGRAM LIMITED BUBBLE STUDY    No orders of the defined types were placed in this encounter.   Patient Instructions  Medication Instructions:  Your physician recommends that you continue on your current medications as directed. Please refer to the  Current Medication list given to you today.  *If you need a refill on your cardiac medications before your next appointment, please call your pharmacy*   Lab Work: BMET, CBC, Lipid   If you have labs (blood work) drawn today and your tests are completely normal, you will receive your results only by: [place] (if you have MyChart) OR A paper copy in the mail If you have any lab test that is abnormal or we need to change your treatment, we will call you to review the results.   Testing/Procedures: Your physician has requested that you have a cardiac MRI. Cardiac MRI uses a computer to create images of your heart as  its beating, producing both still and moving pictures of your heart and major blood vessels. For further information please visit [email or web address] Please follow the instruction sheet given to you today for more information.  Echo bubble study This will be done at our [facility] location:  [place]: At [facility], you and your health needs are our priority.  As part of our continuing mission to provide you with exceptional heart care, we have created designated Provider Care Teams.  These Care Teams include your primary Cardiologist (physician) and Advanced Practice Providers (APPs -  Physician Assistants and Nurse Practitioners) who all work together to provide you with the care you need, when you need it.  We recommend signing up for the patient portal called "MyChart".  Sign up information is provided on this After Visit Summary.  MyChart is used to connect with patients for Virtual Visits (Telemedicine).  Patients are able to view lab/test results, encounter notes, upcoming appointments, etc.  Non-urgent messages can be sent to your provider as well.   To learn more about what you can do with MyChart, go to [email or web address].    Your next appointment:   6 month(s)  The format for your next appointment:   In  Person  Provider:   Oswaldo Milian, MD    Signed, Donato Heinz, MD  06/01/2021 9:50 AM    [place]

## 2021-06-01 ENCOUNTER — Other Ambulatory Visit: Payer: Self-pay

## 2021-06-01 ENCOUNTER — Ambulatory Visit (INDEPENDENT_AMBULATORY_CARE_PROVIDER_SITE_OTHER): Payer: Medicare HMO | Admitting: Cardiology

## 2021-06-01 ENCOUNTER — Encounter (HOSPITAL_BASED_OUTPATIENT_CLINIC_OR_DEPARTMENT_OTHER): Payer: Self-pay | Admitting: Cardiology

## 2021-06-01 VITALS — BP 130/74 | HR 74 | Ht 75.0 in | Wt 251.0 lb

## 2021-06-01 DIAGNOSIS — I517 Cardiomegaly: Secondary | ICD-10-CM | POA: Diagnosis not present

## 2021-06-01 DIAGNOSIS — Z01812 Encounter for preprocedural laboratory examination: Secondary | ICD-10-CM

## 2021-06-01 DIAGNOSIS — E785 Hyperlipidemia, unspecified: Secondary | ICD-10-CM | POA: Diagnosis not present

## 2021-06-01 DIAGNOSIS — I639 Cerebral infarction, unspecified: Secondary | ICD-10-CM | POA: Diagnosis not present

## 2021-06-01 NOTE — Patient Instructions (Signed)
Medication Instructions:  Your physician recommends that you continue on your current medications as directed. Please refer to the Current Medication list given to you today.  *If you need a refill on your cardiac medications before your next appointment, please call your pharmacy*   Lab Work: BMET, CBC, Lipid   If you have labs (blood work) drawn today and your tests are completely normal, you will receive your results only by: [place] (if you have MyChart) OR A paper copy in the mail If you have any lab test that is abnormal or we need to change your treatment, we will call you to review the results.   Testing/Procedures: Your physician has requested that you have a cardiac MRI. Cardiac MRI uses a computer to create images of your heart as its beating, producing both still and moving pictures of your heart and major blood vessels. For further information please visit [email or web address] Please follow the instruction sheet given to you today for more information.  Echo bubble study This will be done at our [facility] location:  [place]: At [facility], you and your health needs are our priority.  As part of our continuing mission to provide you with exceptional heart care, we have created designated Provider Care Teams.  These Care Teams include your primary Cardiologist (physician) and Advanced Practice Providers (APPs -  Physician Assistants and Nurse Practitioners) who all work together to provide you with the care you need, when you need it.  We recommend signing up for the patient portal called "MyChart".  Sign up information is provided on this After Visit Summary.  MyChart is used to connect with patients for Virtual Visits (Telemedicine).  Patients are able to view lab/test results, encounter notes, upcoming appointments, etc.  Non-urgent messages can be sent to your provider as well.   To learn more about what you can do with  MyChart, go to [email or web address].    Your next appointment:   6 month(s)  The format for your next appointment:   In Person  Provider:   Oswaldo Milian, MD

## 2021-06-06 LAB — CBC
Hematocrit: 38.4 % (ref 37.5–51.0)
Hemoglobin: 13.6 g/dL (ref 13.0–17.7)
MCH: 32.1 pg (ref 26.6–33.0)
MCHC: 35.4 g/dL (ref 31.5–35.7)
MCV: 91 fL (ref 79–97)
Platelets: 143 10*3/uL — ABNORMAL LOW (ref 150–450)
RBC: 4.24 x10E6/uL (ref 4.14–5.80)
RDW: 12.9 % (ref 11.6–15.4)
WBC: 6 10*3/uL (ref 3.4–10.8)

## 2021-06-06 LAB — LIPID PANEL
Chol/HDL Ratio: 2.3 ratio (ref 0.0–5.0)
Cholesterol, Total: 107 mg/dL (ref 100–199)
HDL: 46 mg/dL (ref >39)
LDL Chol Calc (NIH): 50 mg/dL (ref 0–99)
Triglycerides: 43 mg/dL (ref 0–149)
VLDL Cholesterol Cal: 11 mg/dL (ref 5–40)

## 2021-06-06 LAB — BASIC METABOLIC PANEL
BUN/Creatinine Ratio: 13 (ref 10–24)
BUN: 13 mg/dL (ref 8–27)
CO2: 22 mmol/L (ref 20–29)
Calcium: 8.7 mg/dL (ref 8.6–10.2)
Chloride: 105 mmol/L (ref 96–106)
Creatinine, Ser: 1.02 mg/dL (ref 0.76–1.27)
Glucose: 102 mg/dL — ABNORMAL HIGH (ref 65–99)
Potassium: 4.4 mmol/L (ref 3.5–5.2)
Sodium: 139 mmol/L (ref 134–144)
eGFR: 79 mL/min/{1.73_m2} (ref >59)

## 2021-06-27 ENCOUNTER — Ambulatory Visit (HOSPITAL_COMMUNITY): Payer: Medicare HMO | Attending: Cardiovascular Disease

## 2021-06-27 ENCOUNTER — Other Ambulatory Visit: Payer: Self-pay

## 2021-06-27 DIAGNOSIS — I517 Cardiomegaly: Secondary | ICD-10-CM

## 2021-06-27 DIAGNOSIS — I639 Cerebral infarction, unspecified: Secondary | ICD-10-CM | POA: Diagnosis present

## 2021-06-27 DIAGNOSIS — I2510 Atherosclerotic heart disease of native coronary artery without angina pectoris: Secondary | ICD-10-CM | POA: Insufficient documentation

## 2021-06-27 DIAGNOSIS — Z87891 Personal history of nicotine dependence: Secondary | ICD-10-CM | POA: Insufficient documentation

## 2021-06-27 LAB — ECHOCARDIOGRAM LIMITED BUBBLE STUDY
Area-P 1/2: 4.02 cm2
S' Lateral: 2.1 cm

## 2021-06-29 ENCOUNTER — Other Ambulatory Visit: Payer: Self-pay | Admitting: *Deleted

## 2021-06-29 DIAGNOSIS — I77819 Aortic ectasia, unspecified site: Secondary | ICD-10-CM

## 2021-07-23 ENCOUNTER — Telehealth (HOSPITAL_COMMUNITY): Payer: Self-pay | Admitting: Emergency Medicine

## 2021-07-23 NOTE — Telephone Encounter (Signed)
Reaching out to patient to offer assistance regarding upcoming cardiac imaging study; pt verbalizes understanding of appt date/time, parking situation and where to check in, and verified current allergies; name and call back number provided for further questions should they arise Garrett Bond RN [place] [facility] [phone] office [phone] cell   Denies metals Denies claustro Denies iv issues

## 2021-07-25 ENCOUNTER — Ambulatory Visit (HOSPITAL_COMMUNITY)
Admission: RE | Admit: 2021-07-25 | Discharge: 2021-07-25 | Disposition: A | Discharge status: Home / Self Care | Payer: Medicare HMO | Source: Ambulatory Visit | Attending: Cardiology | Admitting: Cardiology

## 2021-07-25 ENCOUNTER — Other Ambulatory Visit: Payer: Self-pay

## 2021-07-25 DIAGNOSIS — I517 Cardiomegaly: Secondary | ICD-10-CM

## 2021-07-25 IMAGING — MR MR CARD MORPHOLOGY WO/W CM
45 of 48 series · 45 of 48 positions shown · IV contrast (gadavist)
Comparison: none

CLINICAL DATA: LVH

EXAM:
CARDIAC MRI
TECHNIQUE: The patient was scanned on a 1.5 Tesla Siemens magnet. A dedicated
cardiac coil was used. Functional imaging was done using Fiesta
sequences. [DATE], and 4 chamber views were done to assess for RWMA's.
Modified YAMINI rule using a short axis stack was used to
calculate an ejection fraction on a dedicated work station using
Circle software. The patient received 14 cc of Gadavist. After 10
minutes inversion recovery sequences were used to assess for
infiltration and scar tissue.
CONTRAST:  14 cc  of Gadavist

[Series 4: [id number]_bh · axial · 8.0mm · 1.48mm/px · 1 of 16 slices shown]
[im 1/16]
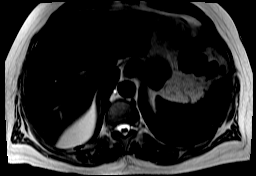

[Series 8: bSSFP · oblique · 8.0mm · 1.65mm/px · 1 of 25 slices shown (1 of 22)]
[im 1/25]
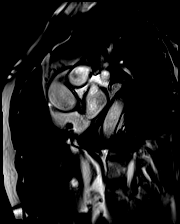

[Series 9: bSSFP · oblique · 8.0mm · 1.65mm/px · 1 of 25 slices shown (2 of 22)]
[im 1/25]
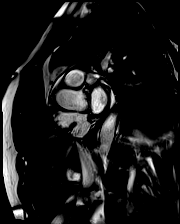

[Series 10: bSSFP · oblique · 8.0mm · 1.65mm/px · 1 of 25 slices shown (3 of 22)]
[im 1/25]
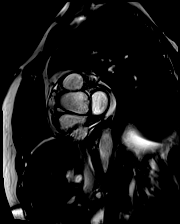

[Series 11: bSSFP · oblique · 8.0mm · 1.65mm/px · 1 of 25 slices shown (4 of 22)]
[im 1/25]
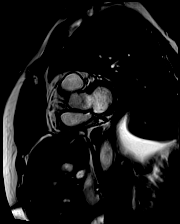

[Series 12: bSSFP · oblique · 8.0mm · 1.65mm/px · 1 of 25 slices shown (5 of 22)]
[im 1/25]
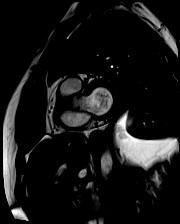

[Series 13: bSSFP · oblique · 8.0mm · 1.65mm/px · 1 of 25 slices shown (6 of 22)]
[im 1/25]
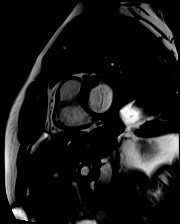

[Series 14: bSSFP · oblique · 8.0mm · 1.65mm/px · 1 of 25 slices shown (7 of 22)]
[im 1/25]
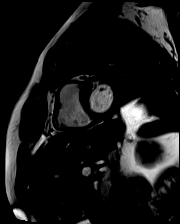

[Series 15: bSSFP · oblique · 8.0mm · 1.65mm/px · 1 of 25 slices shown (8 of 22)]
[im 1/25]
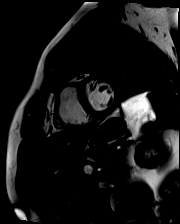

[Series 16: bSSFP · oblique · 8.0mm · 1.65mm/px · 1 of 25 slices shown (9 of 22)]
[im 1/25]
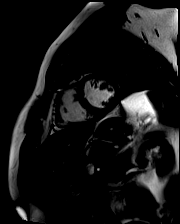

[Series 17: bSSFP · oblique · 8.0mm · 1.65mm/px · 1 of 25 slices shown (10 of 22)]
[im 1/25]
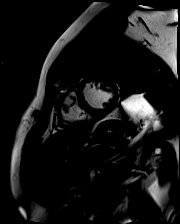

[Series 18: bSSFP · oblique · 8.0mm · 1.65mm/px · 1 of 25 slices shown (11 of 22)]
[im 1/25]
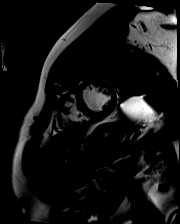

[Series 19: bSSFP · oblique · 8.0mm · 1.65mm/px · 1 of 25 slices shown (12 of 22)]
[im 1/25]
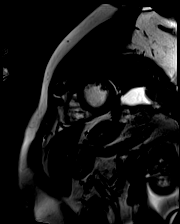

[Series 20: bSSFP · oblique · 8.0mm · 1.65mm/px · 1 of 25 slices shown (13 of 22)]
[im 1/25]
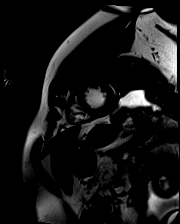

[Series 21: bSSFP · oblique · 8.0mm · 1.65mm/px · 1 of 25 slices shown (14 of 22)]
[im 1/25]
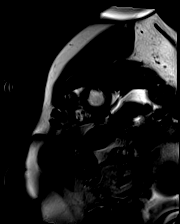

[Series 22: bSSFP · oblique · 8.0mm · 1.65mm/px · 1 of 25 slices shown (15 of 22)]
[im 1/25]
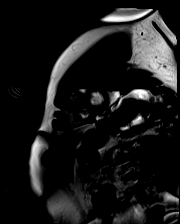

[Series 23: bSSFP · oblique · 8.0mm · 1.65mm/px · 1 of 25 slices shown (16 of 22)]
[im 1/25]
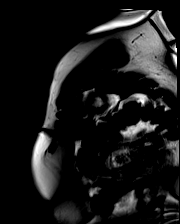

[Series 24: bSSFP · oblique · 8.0mm · 1.65mm/px · 1 of 25 slices shown (17 of 22)]
[im 1/25]
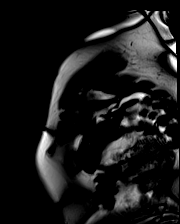

[Series 25: bSSFP · oblique · 8.0mm · 1.65mm/px · 1 of 25 slices shown (18 of 22)]
[im 1/25]
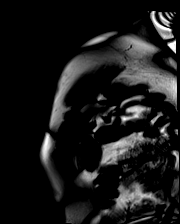

[Series 26: bSSFP · oblique · 8.0mm · 1.65mm/px · 1 of 25 slices shown (19 of 22)]
[im 1/25]
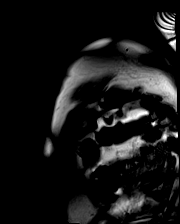

[Series 27: (id)_long_t1 · oblique · 8.0mm · 1.56mm/px · 1 of 24 slices shown]
[im 1/24]
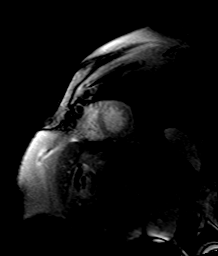

[Series 28: (id)_long_t1_moco · oblique · 8.0mm · 1.56mm/px · 1 of 24 slices shown]
[im 1/24]
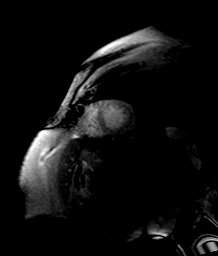

[Series 29: (id)_long_t1_moco_t1 · oblique · 8.0mm · 1.56mm/px · 1 of 3 slices shown]
[im 1/3]
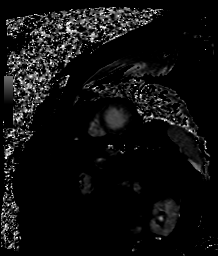

[Series 31: (id)_trufi · oblique · 8.0mm · 2.08mm/px · 1 of 9 slices shown]
[im 1/9]
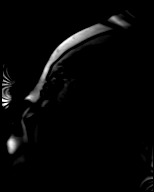

[Series 32: (id)_trufi_moco · oblique · 8.0mm · 2.08mm/px · 1 of 9 slices shown]
[im 1/9]
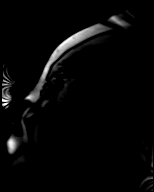

[Series 33: (id)_trufi_moco_t2 · oblique · 8.0mm · 2.08mm/px · 1 of 3 slices shown]
[im 1/3]
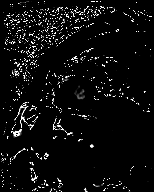

[Series 35: bSSFP · axial · 6.0mm · 1.48mm/px · 1 of 25 slices shown (20 of 22)]
[im 1/25]
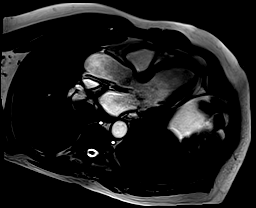

[Series 36: bSSFP · oblique · 6.0mm · 1.41mm/px · 1 of 25 slices shown (21 of 22)]
[im 1/25]
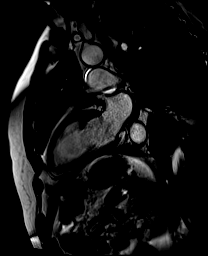

[Series 37: bSSFP · axial · 6.0mm · 1.48mm/px · 1 of 25 slices shown (22 of 22)]
[im 1/25]
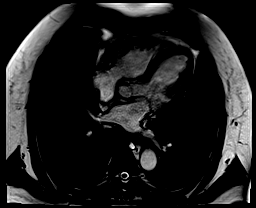

[Series 38: cine_trufi_cs_rt_short axis · oblique · 8.0mm · 1.83mm/px · 1 of 18 slices shown (1 of 16)]
[im 1/18]
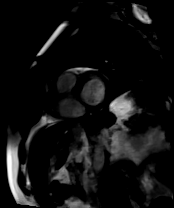

[Series 38: cine_trufi_cs_rt_short axis · oblique · 8.0mm · 1.83mm/px · 1 of 18 slices shown (2 of 16)]
[im 1/18]
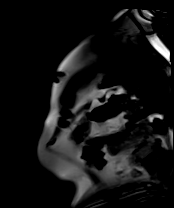

[Series 38: cine_trufi_cs_rt_short axis · oblique · 8.0mm · 1.83mm/px · 1 of 18 slices shown (3 of 16)]
[im 1/18]
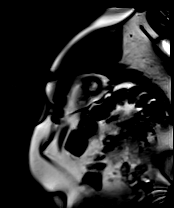

[Series 38: cine_trufi_cs_rt_short axis · oblique · 8.0mm · 1.83mm/px · 1 of 18 slices shown (4 of 16)]
[im 1/18]
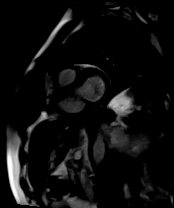

[Series 38: cine_trufi_cs_rt_short axis · oblique · 8.0mm · 1.83mm/px · 1 of 18 slices shown (5 of 16)]
[im 1/18]
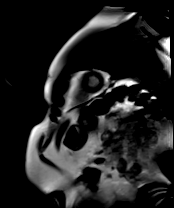

[Series 38: cine_trufi_cs_rt_short axis · oblique · 8.0mm · 1.83mm/px · 1 of 18 slices shown (6 of 16)]
[im 1/18]
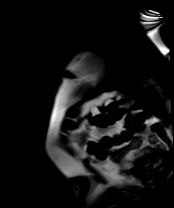

[Series 38: cine_trufi_cs_rt_short axis · oblique · 8.0mm · 1.83mm/px · 1 of 18 slices shown (7 of 16)]
[im 1/18]
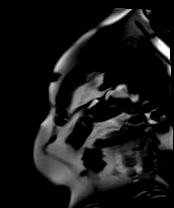

[Series 38: cine_trufi_cs_rt_short axis · oblique · 8.0mm · 1.83mm/px · 1 of 18 slices shown (8 of 16)]
[im 1/18]
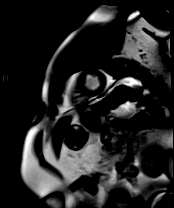

[Series 38: cine_trufi_cs_rt_short axis · oblique · 8.0mm · 1.83mm/px · 1 of 18 slices shown (9 of 16)]
[im 1/18]
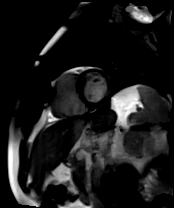

[Series 38: cine_trufi_cs_rt_short axis · oblique · 8.0mm · 1.83mm/px · 1 of 18 slices shown (10 of 16)]
[im 1/18]
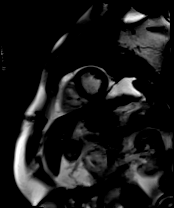

[Series 38: cine_trufi_cs_rt_short axis · oblique · 8.0mm · 1.83mm/px · 1 of 18 slices shown (11 of 16)]
[im 1/18]
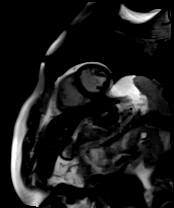

[Series 38: cine_trufi_cs_rt_short axis · oblique · 8.0mm · 1.83mm/px · 1 of 18 slices shown (12 of 16)]
[im 1/18]
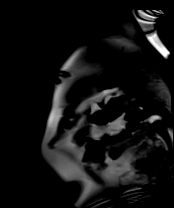

[Series 38: cine_trufi_cs_rt_short axis · oblique · 8.0mm · 1.83mm/px · 1 of 18 slices shown (13 of 16)]
[im 1/18]
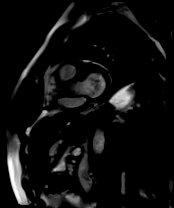

[Series 38: cine_trufi_cs_rt_short axis · oblique · 8.0mm · 1.83mm/px · 1 of 18 slices shown (14 of 16)]
[im 1/18]
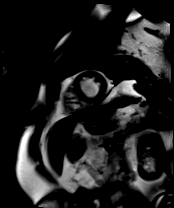

[Series 38: cine_trufi_cs_rt_short axis · oblique · 8.0mm · 1.83mm/px · 1 of 18 slices shown (15 of 16)]
[im 1/18]
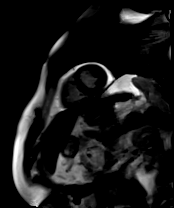

[Series 38: cine_trufi_cs_rt_short axis · oblique · 8.0mm · 1.83mm/px · 1 of 18 slices shown (16 of 16)]
[im 1/18]
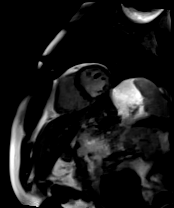

[45 of 48 positions shown; findings below may reference images not displayed]

FINDINGS: Left ventricle:

-Asymmetric hypertrophy measuring up to 14mm in basal septum (7mm in
posterior wall)

-Normal size

-Normal systolic function

-Normal ECV (27%)

-No LGE

LV EF: 64% (Normal 56-78%)

Absolute volumes:

LV EDV: 145mL (Normal 77-195 mL)

LV ESV: 52mL (Normal 19-72 mL)

LV SV: 93mL (Normal 51-133 mL)

CO: 5.6L/min (Normal 2.8-8.8 L/min)

Indexed volumes:

LV EDV: 59mL/sq-m (Normal 47-92 mL/sq-m)

LV ESV: 21mL/sq-m (Normal 13-30 mL/sq-m)

LV SV: 38mL/sq-m (Normal 32-62 mL/sq-m)

CI: 2.3L/min/sq-m (Normal 1.7-4.2 L/min/sq-m)

Right ventricle: Normal size and systolic function

RV EF:  61% (Normal 47-74%)

Absolute volumes:

RV EDV: 154mL (Normal 88-227 mL)

RV ESV: 61mL (Normal 23-103 mL)

RV SV: 93mL (Normal 52-138 mL)

CO: 5.6L/min (Normal 2.8-8.8 L/min)

Indexed volumes:

RV EDV: 63mL/sq-m (Normal 55-105 mL/sq-m)

RV ESV: 25mL/sq-m (Normal 15-43 mL/sq-m)

RV SV: 38mL/sq-m (Normal 32-64 mL/sq-m)

CI: 2.3L/min/sq-m (Normal 1.7-4.2 L/min/sq-m)

Left atrium: Mild enlargement

Right atrium: Normal size

Mitral valve: Mild regurgitation

Aortic valve: Mild regurgitation

Tricuspid valve:Trivial regurgitation

Pulmonic valve: No regurgitation

Aorta: Dilated ascending aorta measuring 41mm

Pericardium: Normal
IMPRESSION: 1. Asymmetric LV hypertrophy measuring up to 14mm in basal septum
(7mm in posterior wall). Does not meet criteria for hypertrophic
cardiomyopathy (<15mm). No evidence of amyloidosis

2.  No late gadolinium enhancement to suggest myocardial scar

3.  Normal LV size and systolic function (EF 64%)

4.  Normal RV size and systolic function (EF 61%)

5.  Dilated ascending aorta measuring 41mm

## 2021-07-25 MED ORDER — GADOBUTROL 1 MMOL/ML IV SOLN
14.0000 mL | Freq: Once | INTRAVENOUS | Status: AC | PRN
  Administered 2021-07-25: 14 mL via INTRAVENOUS

## 2021-12-07 ENCOUNTER — Encounter (HOSPITAL_BASED_OUTPATIENT_CLINIC_OR_DEPARTMENT_OTHER): Payer: Self-pay | Admitting: Cardiology

## 2021-12-31 ENCOUNTER — Telehealth: Payer: Self-pay | Admitting: Cardiology

## 2021-12-31 NOTE — Telephone Encounter (Signed)
Pt send via mychart to scheduling pool:   ? ?He would like to know if he need to come in for blood work before his appt in April ?   ? ?Best number [phone] ?

## 2021-12-31 NOTE — Telephone Encounter (Signed)
Left message for pt to let him know that we will send his message to Dr. Gardiner Rhyme to advise.  ?

## 2022-01-01 NOTE — Telephone Encounter (Signed)
Attempt to call patient, call cannot be completed at this time.  ?

## 2022-01-01 NOTE — Telephone Encounter (Signed)
No blood work needed

## 2022-01-11 NOTE — Telephone Encounter (Signed)
Mychart message sent to patient to make aware.  ?

## 2022-01-18 ENCOUNTER — Ambulatory Visit (INDEPENDENT_AMBULATORY_CARE_PROVIDER_SITE_OTHER): Payer: Medicare HMO | Admitting: Cardiology

## 2022-01-18 ENCOUNTER — Encounter: Payer: Self-pay | Admitting: Cardiology

## 2022-01-18 VITALS — BP 110/78 | HR 74 | Ht 75.0 in | Wt 261.0 lb

## 2022-01-18 DIAGNOSIS — E785 Hyperlipidemia, unspecified: Secondary | ICD-10-CM

## 2022-01-18 DIAGNOSIS — I639 Cerebral infarction, unspecified: Secondary | ICD-10-CM | POA: Diagnosis not present

## 2022-01-18 DIAGNOSIS — I517 Cardiomegaly: Secondary | ICD-10-CM | POA: Diagnosis not present

## 2022-01-18 DIAGNOSIS — I10 Essential (primary) hypertension: Secondary | ICD-10-CM

## 2022-01-18 DIAGNOSIS — I77819 Aortic ectasia, unspecified site: Secondary | ICD-10-CM

## 2022-01-18 NOTE — Patient Instructions (Signed)
Medication Instructions:  ?Your physician recommends that you continue on your current medications as directed. Please refer to the Current Medication list given to you today. ? ?*If you need a refill on your cardiac medications before your next appointment, please call your pharmacy* ? ?Testing/Procedures: ?CTA chest/aorta due September 2023 ? ?Follow-Up: ?At [facility], you and your health needs are our priority.  As part of our continuing mission to provide you with exceptional heart care, we have created designated Provider Care Teams.  These Care Teams include your primary Cardiologist (physician) and Advanced Practice Providers (APPs -  Physician Assistants and Nurse Practitioners) who all work together to provide you with the care you need, when you need it. ? ?We recommend signing up for the patient portal called "MyChart".  Sign up information is provided on this After Visit Summary.  MyChart is used to connect with patients for Virtual Visits (Telemedicine).  Patients are able to view lab/test results, encounter notes, upcoming appointments, etc.  Non-urgent messages can be sent to your provider as well.   ?To learn more about what you can do with MyChart, go to [email or web address].   ? ?Your next appointment:   ?12 month(s) ? ?The format for your next appointment:   ?In Person ? ?Provider:   ?Dr. Gardiner Rhyme ? ? ?

## 2022-01-18 NOTE — Progress Notes (Signed)
?Cardiology Office Note:   ? ?Date:  01/18/2022  ? ?Garrett Garcia, DOB [birth year], MRN [id number] ? ?PCP:  Jonathon Bellows, PA-C  ?Cardiologist:  None  ?Electrophysiologist:  None  ? ?Referring MD: Keane Scrape*  ? ?Chief Complaint  ?Patient presents with  ? Follow-up  ?  6 months.  ? ? ? ?History of Present Illness:   ? ?Garrett Garcia is a 71 y.o. male with a hx of prostate cancer, pituitary adenoma status post gamma knife surgery at [facility], CVA who presents for follow-up.  He was admitted to [facility] 02/2021 with double vision.  MRI showed punctate stroke at the right posterior parietal cortex.  Echocardiogram showed EF 65 to 70%, normal RV function, normal diastolic function, mild asymmetric hypertrophy of LV basal septum, no significant valvular disease.  Neurology was consulted and he was started on aspirin/Plavix x3 weeks with plan for aspirin alone moving forward.  30-day monitor showed no significant abnormalities.  Echo 06/27/2021 showed suboptimal bubble study but no shunting seen, EF 60 to 65%, mild dilatation of ascending aorta measuring 42 mm.  Cardiac MRI 07/25/2021 showed asymmetric LV hypertrophy measuring 14 mm and basal septum (not meeting criteria for HCM) and no evidence of amyloidosis, no LGE, normal biventricular size and systolic function. ? ?Since last clinic visit, he reports that he has been doing well.  Denies any chest pain, dyspnea, lightheadedness, syncope, lower extremity edema, or palpitations.  Goes to gym 3 days per week for 30-45 minutes.  Denies any exertional symptoms. ? ? ?Past Medical History:  ?Diagnosis Date  ? Prostate cancer ([place]) 2019  ? Treated with Radiation   ? ? ?Past Surgical History:  ?Procedure Laterality Date  ? gamma knife for pituitary adenoma    ? ? ?Current Medications: ?Current Meds  ?Medication Sig  ? aspirin EC 81 MG EC tablet Take 1 tablet (81 mg total) by mouth daily. Swallow whole.  ? atorvastatin (LIPITOR) 20 MG tablet Take  1 tablet (20 mg total) by mouth daily.  ? Coenzyme Q10 50 MG CAPS Take by mouth daily.  ? lisinopril (ZESTRIL) 2.5 MG tablet Take 2.5 mg by mouth daily.  ? Multiple Vitamins-Minerals (MULTIVITAMIN ADULTS 50+) TABS Take 1 tablet by mouth daily.  ? sildenafil (REVATIO) 20 MG tablet as needed.  ? Tart Cherry 1200 MG CAPS Take by mouth daily.  ?  ? ?Allergies:   Patient has no known allergies.  ? ?Social History  ? ?Socioeconomic History  ? Marital status: Married  ?  Spouse name: Not on file  ? Number of children: Not on file  ? Years of education: Not on file  ? Highest education level: Not on file  ?Occupational History  ? Not on file  ?Tobacco Use  ? Smoking status: Former  ?  Types: Cigarettes  ?  Quit date: 30  ?  Years since quitting: 51.2  ? Smokeless tobacco: Never  ?Substance and Sexual Activity  ? Alcohol use: Never  ? Drug use: Never  ? Sexual activity: Not on file  ?Other Topics Concern  ? Not on file  ?Social History Narrative  ? Lives at home with wife.  No assistance with ambulation.  Independent of ADLs.  ? ?Social Determinants of Health  ? ?Financial Resource Strain: Not on file  ?Food Insecurity: Not on file  ?Transportation Needs: Not on file  ?Physical Activity: Not on file  ?Stress: Not on file  ?Social Connections: Not on file  ?  ? ?  Family History: ?The patient's family history includes CAD in his father. ? ?ROS:   ?Please see the history of present illness.    ? All other systems reviewed and are negative. ? ?EKGs/Labs/Other Studies Reviewed:   ? ?The following studies were reviewed today: ? ? ?EKG:  ?01/18/2022: Normal sinus rhythm, rate 74, no ST ? ?Recent Labs: ?03/08/2021: ALT 18 ?06/06/2021: BUN 13; Creatinine, Ser 1.02; Hemoglobin 13.6; Platelets 143; Potassium 4.4; Sodium 139  ?Recent Lipid Panel ?   ?Component Value Date/Time  ? CHOL 107 06/06/2021 0854  ? TRIG 43 06/06/2021 0854  ? HDL 46 06/06/2021 0854  ? CHOLHDL 2.3 06/06/2021 0854  ? CHOLHDL 3.5 03/08/2021 0247  ? VLDL 14 03/08/2021  0247  ? [place] 50 06/06/2021 0854  ? ? ?Physical Exam:   ? ?VS:  BP 110/78 (BP Location: Left Arm, Patient Position: Sitting, Cuff Size: Large)   Pulse 74   Ht '6\' 3"'$  (1.905 m)   Wt 261 lb (118.4 kg)   BMI 32.62 kg/m?    ? ?Wt Readings from Last 3 Encounters:  ?01/18/22 261 lb (118.4 kg)  ?06/01/21 251 lb (113.9 kg)  ?03/07/21 250 lb (113.4 kg)  ?  ? ?GEN:  Well nourished, well developed in no acute distress ?HEENT: Normal ?NECK: No JVD; No carotid bruits ?LYMPHATICS: No lymphadenopathy ?CARDIAC: RRR, no murmurs, rubs, gallops ?RESPIRATORY:  Clear to auscultation without rales, wheezing or rhonchi  ?ABDOMEN: Soft, non-tender, non-distended ?MUSCULOSKELETAL:  No edema; No deformity  ?SKIN: Warm and dry ?NEUROLOGIC:  Alert and oriented x 3 ?PSYCHIATRIC:  Normal affect  ? ?ASSESSMENT:   ? ?1. Cerebrovascular accident (CVA), unspecified mechanism ([place])   ?2. LVH (left ventricular hypertrophy)   ?3. Essential hypertension   ?4. Hyperlipidemia, unspecified hyperlipidemia type   ?5. Aortic dilatation (HCC)   ? ? ?PLAN:   ? ? ?CVA: Continue aspirin, statin.  30-day monitor shows no significant abnormalities.  Follows with neurology at [facility].  Echo 06/27/2021 showed suboptimal bubble study but no shunting seen, EF 60 to 65%, mild dilatation of ascending aorta measuring 42 mm.   ? ?LVH: Moderate asymmetric LVH of basal septum on echo by my read.  Differential includes hypertensive heart disease versus HCM versus amyloid.  Cardiac MRI 07/25/2021 showed asymmetric LV hypertrophy measuring 14 mm and basal septum (not meeting criteria for HCM) and no evidence of amyloidosis, no LGE, normal biventricular size and systolic function. ?- Likely due to hypertension, which appears controlled, no further work-up recommended at this time ? ?Hyperlipidemia: LDL 50 on 06/06/2021.  Continue atorvastatin 20 mg daily.   ? ?Hypertension: started lisinopril 5 mg daily.  Felt lightheaded, this was reduced to 2.5 mg daily.  Appears  controlled ? ?Aortic dilatation: Measured 42 mm in ascending aorta on echo 06/2021.  Plan CTA chest in 1 year ? ?RTC in 1 year ? ? ?Medication Adjustments/Labs and Tests Ordered: ?Current medicines are reviewed at length with the patient today.  Concerns regarding medicines are outlined above.  ?Orders Placed This Encounter  ?Procedures  ? EKG 12-Lead  ? ? ?No orders of the defined types were placed in this encounter. ? ? ? ?Patient Instructions  ?Medication Instructions:  ?Your physician recommends that you continue on your current medications as directed. Please refer to the Current Medication list given to you today. ? ?*If you need a refill on your cardiac medications before your next appointment, please call your pharmacy* ? ?Testing/Procedures: ?CTA chest/aorta due September 2023 ? ?Follow-Up: ?At  [facility], you and your health needs are our priority.  As part of our continuing mission to provide you with exceptional heart care, we have created designated Provider Care Teams.  These Care Teams include your primary Cardiologist (physician) and Advanced Practice Providers (APPs -  Physician Assistants and Nurse Practitioners) who all work together to provide you with the care you need, when you need it. ? ?We recommend signing up for the patient portal called "MyChart".  Sign up information is provided on this After Visit Summary.  MyChart is used to connect with patients for Virtual Visits (Telemedicine).  Patients are able to view lab/test results, encounter notes, upcoming appointments, etc.  Non-urgent messages can be sent to your provider as well.   ?To learn more about what you can do with MyChart, go to [email or web address].   ? ?Your next appointment:   ?12 month(s) ? ?The format for your next appointment:   ?In Person ? ?Provider:   ?Dr. Gardiner Rhyme ? ?  ? ?Signed, ?Donato Heinz, MD  ?01/18/2022 10:02 AM    ?[place] ? ?

## 2022-05-07 ENCOUNTER — Other Ambulatory Visit: Payer: Self-pay | Admitting: *Deleted

## 2022-05-07 DIAGNOSIS — Z01812 Encounter for preprocedural laboratory examination: Secondary | ICD-10-CM

## 2022-05-07 DIAGNOSIS — I77819 Aortic ectasia, unspecified site: Secondary | ICD-10-CM

## 2022-05-15 ENCOUNTER — Encounter: Payer: Self-pay | Admitting: Cardiology

## 2022-06-10 ENCOUNTER — Ambulatory Visit
Admission: RE | Admit: 2022-06-10 | Discharge: 2022-06-10 | Disposition: A | Discharge status: Home / Self Care | Payer: Medicare HMO | Source: Ambulatory Visit | Attending: Cardiology | Admitting: Cardiology

## 2022-06-10 DIAGNOSIS — I77819 Aortic ectasia, unspecified site: Secondary | ICD-10-CM

## 2022-06-10 MED ORDER — IOPAMIDOL (ISOVUE-370) INJECTION 76%
75.0000 mL | Freq: Once | INTRAVENOUS | Status: AC | PRN
  Administered 2022-06-10: 75 mL via INTRAVENOUS

## 2022-06-11 ENCOUNTER — Other Ambulatory Visit: Payer: Self-pay

## 2022-06-11 DIAGNOSIS — I77819 Aortic ectasia, unspecified site: Secondary | ICD-10-CM

## 2022-12-16 ENCOUNTER — Encounter (HOSPITAL_BASED_OUTPATIENT_CLINIC_OR_DEPARTMENT_OTHER): Payer: Self-pay | Admitting: Cardiology

## 2023-02-17 ENCOUNTER — Encounter: Payer: Self-pay | Admitting: Cardiology

## 2023-02-17 DIAGNOSIS — I10 Essential (primary) hypertension: Secondary | ICD-10-CM

## 2023-02-17 DIAGNOSIS — E785 Hyperlipidemia, unspecified: Secondary | ICD-10-CM

## 2023-02-17 DIAGNOSIS — I517 Cardiomegaly: Secondary | ICD-10-CM

## 2023-02-18 NOTE — Telephone Encounter (Signed)
Recommend CMET, CBC, lipid panel

## 2023-02-20 LAB — CBC
Hematocrit: 40 % (ref 37.5–51.0)
Hemoglobin: 13.4 g/dL (ref 13.0–17.7)
MCH: 30.9 pg (ref 26.6–33.0)
MCHC: 33.5 g/dL (ref 31.5–35.7)
MCV: 92 fL (ref 79–97)
Platelets: 146 10*3/uL — ABNORMAL LOW (ref 150–450)
RBC: 4.33 x10E6/uL (ref 4.14–5.80)
RDW: 12.4 % (ref 11.6–15.4)
WBC: 6 10*3/uL (ref 3.4–10.8)

## 2023-02-20 LAB — COMPREHENSIVE METABOLIC PANEL
ALT: 19 IU/L (ref 0–44)
AST: 17 IU/L (ref 0–40)
Albumin/Globulin Ratio: 1.8 (ref 1.2–2.2)
Albumin: 4.2 g/dL (ref 3.8–4.8)
Alkaline Phosphatase: 80 IU/L (ref 44–121)
BUN/Creatinine Ratio: 12 (ref 10–24)
BUN: 13 mg/dL (ref 8–27)
Bilirubin Total: 0.9 mg/dL (ref 0.0–1.2)
CO2: 22 mmol/L (ref 20–29)
Calcium: 9.1 mg/dL (ref 8.6–10.2)
Chloride: 106 mmol/L (ref 96–106)
Creatinine, Ser: 1.07 mg/dL (ref 0.76–1.27)
Globulin, Total: 2.3 g/dL (ref 1.5–4.5)
Glucose: 100 mg/dL — ABNORMAL HIGH (ref 70–99)
Potassium: 4.6 mmol/L (ref 3.5–5.2)
Sodium: 141 mmol/L (ref 134–144)
Total Protein: 6.5 g/dL (ref 6.0–8.5)
eGFR: 74 mL/min/{1.73_m2} (ref >59)

## 2023-02-20 LAB — LIPID PANEL
Chol/HDL Ratio: 2 ratio (ref 0.0–5.0)
Cholesterol, Total: 96 mg/dL — ABNORMAL LOW (ref 100–199)
HDL: 47 mg/dL (ref >39)
LDL Chol Calc (NIH): 38 mg/dL (ref 0–99)
Triglycerides: 39 mg/dL (ref 0–149)
VLDL Cholesterol Cal: 11 mg/dL (ref 5–40)

## 2023-03-04 ENCOUNTER — Ambulatory Visit: Payer: Medicare HMO | Attending: Cardiology | Admitting: Cardiology

## 2023-03-04 ENCOUNTER — Encounter: Payer: Self-pay | Admitting: Cardiology

## 2023-03-04 VITALS — BP 128/84 | HR 61 | Ht 75.0 in | Wt 257.0 lb

## 2023-03-04 DIAGNOSIS — E785 Hyperlipidemia, unspecified: Secondary | ICD-10-CM | POA: Diagnosis not present

## 2023-03-04 DIAGNOSIS — I77819 Aortic ectasia, unspecified site: Secondary | ICD-10-CM | POA: Diagnosis not present

## 2023-03-04 DIAGNOSIS — I517 Cardiomegaly: Secondary | ICD-10-CM | POA: Diagnosis not present

## 2023-03-04 DIAGNOSIS — I10 Essential (primary) hypertension: Secondary | ICD-10-CM | POA: Diagnosis not present

## 2023-03-04 DIAGNOSIS — I639 Cerebral infarction, unspecified: Secondary | ICD-10-CM | POA: Diagnosis not present

## 2023-03-04 NOTE — Progress Notes (Signed)
Cardiology Office Note:    Date:  03/04/2023   ID:  GURJEET MAVITY, DOB 21-Jan-1951, MRN [id number]  PCP:  Bailey Mech, PA-C  Cardiologist:  None  Electrophysiologist:  None   Referring MD: Bernerd Pho*   Chief Complaint  Patient presents with   Cerebrovascular Accident     History of Present Illness:    IAAN KRABILL is a 72 y.o. male with a hx of prostate cancer, pituitary adenoma status post gamma knife surgery at [facility], CVA who presents for follow-up.  He was admitted to [facility] 02/2021 with double vision.  MRI showed punctate stroke at the right posterior parietal cortex.  Echocardiogram showed EF 65 to 70%, normal RV function, normal diastolic function, mild asymmetric hypertrophy of LV basal septum, no significant valvular disease.  Neurology was consulted and he was started on aspirin/Plavix x3 weeks with plan for aspirin alone moving forward.  30-day monitor showed no significant abnormalities.  Echo 06/27/2021 showed suboptimal bubble study but no shunting seen, EF 60 to 65%, mild dilatation of ascending aorta measuring 42 mm.  Cardiac MRI 07/25/2021 showed asymmetric LV hypertrophy measuring 14 mm and basal septum (not meeting criteria for HCM) and no evidence of amyloidosis, no LGE, normal biventricular size and systolic function.  Since last clinic visit, he reports he is doing well.  Denies any chest pain, dyspnea, lightheadedness, syncope, lower extremity edema, or palpitations.  Reports he continues to exercise regularly, goes to gym 3 to 4 days/week.   Past Medical History:  Diagnosis Date   Prostate cancer (HCC) 2019   Treated with Radiation     Past Surgical History:  Procedure Laterality Date   gamma knife for pituitary adenoma      Current Medications: Current Meds  Medication Sig   aspirin EC 81 MG EC tablet Take 1 tablet (81 mg total) by mouth daily. Swallow whole.   atorvastatin (LIPITOR) 20 MG tablet Take 1 tablet (20 mg  total) by mouth daily.   Coenzyme Q10 50 MG CAPS Take by mouth daily.   lisinopril (ZESTRIL) 2.5 MG tablet Take 2.5 mg by mouth daily.   Multiple Vitamins-Minerals (MULTIVITAMIN ADULTS 50+) TABS Take 1 tablet by mouth daily.   sildenafil (REVATIO) 20 MG tablet as needed.   Tart Cherry 1200 MG CAPS Take by mouth daily.     Allergies:   Patient has no known allergies.   Social History   Socioeconomic History   Marital status: Married    Spouse name: Not on file   Number of children: Not on file   Years of education: Not on file   Highest education level: Not on file  Occupational History   Not on file  Tobacco Use   Smoking status: Former    Types: Cigarettes    Quit date: 40    Years since quitting: 52.4   Smokeless tobacco: Never  Substance and Sexual Activity   Alcohol use: Never   Drug use: Never   Sexual activity: Not on file  Other Topics Concern   Not on file  Social History Narrative   Lives at home with wife.  No assistance with ambulation.  Independent of ADLs.   Social Determinants of Health   Financial Resource Strain: Not on file  Food Insecurity: Not on file  Transportation Needs: Not on file  Physical Activity: Not on file  Stress: Not on file  Social Connections: Not on file     Family History: The patient's family  history includes CAD in his father.  ROS:   Please see the history of present illness.     All other systems reviewed and are negative.  EKGs/Labs/Other Studies Reviewed:    The following studies were reviewed today:   EKG:  01/18/2022: Normal sinus rhythm, rate 74, no ST 03/04/2023: Normal sinus rhythm, rate 61, no ST abnormality  Recent Labs: 02/19/2023: ALT 19; BUN 13; Creatinine, Ser 1.07; Hemoglobin 13.4; Platelets 146; Potassium 4.6; Sodium 141  Recent Lipid Panel    Component Value Date/Time   CHOL 96 (L) 02/19/2023 0814   TRIG 39 02/19/2023 0814   HDL 47 02/19/2023 0814   CHOLHDL 2.0 02/19/2023 0814   CHOLHDL 3.5  03/08/2021 0247   VLDL 14 03/08/2021 0247   LDLCALC 38 02/19/2023 0814    Physical Exam:    VS:  BP 128/84 (BP Location: Right Arm, Patient Position: Sitting, Cuff Size: Normal)   Pulse 61   Ht 6\' 3"  (1.905 m)   Wt 257 lb (116.6 kg)   SpO2 99%   BMI 32.12 kg/m     Wt Readings from Last 3 Encounters:  03/04/23 257 lb (116.6 kg)  01/18/22 261 lb (118.4 kg)  06/01/21 251 lb (113.9 kg)     GEN:  Well nourished, well developed in no acute distress HEENT: Normal NECK: No JVD; No carotid bruits LYMPHATICS: No lymphadenopathy CARDIAC: RRR, no murmurs, rubs, gallops RESPIRATORY:  Clear to auscultation without rales, wheezing or rhonchi  ABDOMEN: Soft, non-tender, non-distended MUSCULOSKELETAL:  No edema; No deformity  SKIN: Warm and dry NEUROLOGIC:  Alert and oriented x 3 PSYCHIATRIC:  Normal affect   ASSESSMENT:    1. Aortic dilatation (HCC)   2. Essential hypertension   3. Cerebrovascular accident (CVA), unspecified mechanism (HCC)   4. Hyperlipidemia, unspecified hyperlipidemia type   5. LVH (left ventricular hypertrophy)      PLAN:    CVA: Continue aspirin, statin.  30-day monitor shows no significant abnormalities.  Follows with neurology at [facility].  Echo 06/27/2021 showed suboptimal bubble study but no shunting seen, EF 60 to 65%, mild dilatation of ascending aorta measuring 42 mm.    LVH: Moderate asymmetric LVH of basal septum on echo by my read.  Differential includes hypertensive heart disease versus HCM versus amyloid.  Cardiac MRI 07/25/2021 showed asymmetric LV hypertrophy measuring 14 mm and basal septum (not meeting criteria for HCM) and no evidence of amyloidosis, no LGE, normal biventricular size and systolic function. - Likely due to hypertension, which appears controlled, no further work-up recommended at this time  Hyperlipidemia: LDL 38 on 02/19/23.  Continue atorvastatin 20 mg daily.    Hypertension: On lisinopril 2.5 mg daily  Aortic dilatation:  Measured 42 mm in ascending aorta on echo 06/2021.  CTA chest 05/2022 showed stable ascending aorta measuring 41 mm.  Will monitor, plan echocardiogram in 1 year  RTC in 1 year   Medication Adjustments/Labs and Tests Ordered: Current medicines are reviewed at length with the patient today.  Concerns regarding medicines are outlined above.  Orders Placed This Encounter  Procedures   EKG 12-Lead   ECHOCARDIOGRAM COMPLETE    No orders of the defined types were placed in this encounter.    Patient Instructions  Medication Instructions:  Your physician recommends that you continue on your current medications as directed. Please refer to the Current Medication list given to you today.   *If you need a refill on your cardiac medications before your next appointment, please call  your pharmacy*  Testing/Procedures: Your physician has requested that you have an echocardiogram in 12 MONTHS. Echocardiography is a painless test that uses sound waves to create images of your heart. It provides your doctor with information about the size and shape of your heart and how well your heart's chambers and valves are working. This procedure takes approximately one hour. There are no restrictions for this procedure. Please do NOT wear cologne, perfume, aftershave, or lotions (deodorant is allowed). Please arrive 15 minutes prior to your appointment time.   Follow-Up: At [facility], you and your health needs are our priority.  As part of our continuing mission to provide you with exceptional heart care, we have created designated Provider Care Teams.  These Care Teams include your primary Cardiologist (physician) and Advanced Practice Providers (APPs -  Physician Assistants and Nurse Practitioners) who all work together to provide you with the care you need, when you need it.  We recommend signing up for the patient portal called "MyChart".  Sign up information is provided on this After Visit  Summary.  MyChart is used to connect with patients for Virtual Visits (Telemedicine).  Patients are able to view lab/test results, encounter notes, upcoming appointments, etc.  Non-urgent messages can be sent to your provider as well.   To learn more about what you can do with MyChart, go to [email or web address].    Your next appointment:   12 month(s)  Provider:   Dr. Bjorn Pippin   Signed, Little Ishikawa, MD  03/04/2023 3:12 PM    [facility]

## 2023-03-04 NOTE — Patient Instructions (Signed)
Medication Instructions:  Your physician recommends that you continue on your current medications as directed. Please refer to the Current Medication list given to you today.   *If you need a refill on your cardiac medications before your next appointment, please call your pharmacy*  Testing/Procedures: Your physician has requested that you have an echocardiogram in 12 MONTHS. Echocardiography is a painless test that uses sound waves to create images of your heart. It provides your doctor with information about the size and shape of your heart and how well your heart's chambers and valves are working. This procedure takes approximately one hour. There are no restrictions for this procedure. Please do NOT wear cologne, perfume, aftershave, or lotions (deodorant is allowed). Please arrive 15 minutes prior to your appointment time.   Follow-Up: At [facility], you and your health needs are our priority.  As part of our continuing mission to provide you with exceptional heart care, we have created designated Provider Care Teams.  These Care Teams include your primary Cardiologist (physician) and Advanced Practice Providers (APPs -  Physician Assistants and Nurse Practitioners) who all work together to provide you with the care you need, when you need it.  We recommend signing up for the patient portal called "MyChart".  Sign up information is provided on this After Visit Summary.  MyChart is used to connect with patients for Virtual Visits (Telemedicine).  Patients are able to view lab/test results, encounter notes, upcoming appointments, etc.  Non-urgent messages can be sent to your provider as well.   To learn more about what you can do with MyChart, go to [email or web address].    Your next appointment:   12 month(s)  Provider:   Dr. Bjorn Pippin

## 2023-09-16 ENCOUNTER — Encounter: Payer: Self-pay | Admitting: Cardiology

## 2024-02-10 ENCOUNTER — Encounter: Payer: Self-pay | Admitting: Cardiology

## 2024-02-13 ENCOUNTER — Ambulatory Visit (HOSPITAL_BASED_OUTPATIENT_CLINIC_OR_DEPARTMENT_OTHER): Payer: Medicare HMO

## 2024-02-13 DIAGNOSIS — I5030 Unspecified diastolic (congestive) heart failure: Secondary | ICD-10-CM | POA: Diagnosis not present

## 2024-02-13 DIAGNOSIS — I358 Other nonrheumatic aortic valve disorders: Secondary | ICD-10-CM | POA: Diagnosis not present

## 2024-02-13 DIAGNOSIS — I77819 Aortic ectasia, unspecified site: Secondary | ICD-10-CM

## 2024-02-14 ENCOUNTER — Encounter: Payer: Self-pay | Admitting: Cardiology

## 2024-02-16 LAB — ECHOCARDIOGRAM COMPLETE
AR max vel: 2.84 cm2
AV Area VTI: 3.04 cm2
AV Area mean vel: 3.08 cm2
AV Mean grad: 3 mmHg
AV Peak grad: 6.5 mmHg
Ao pk vel: 1.27 m/s
Area-P 1/2: 2.86 cm2
S' Lateral: 2.22 cm

## 2024-02-17 NOTE — Progress Notes (Signed)
 Cardiology Office Note:    Date:  02/20/2024   ID:  Garrett Garcia, DOB July 12, 1951, MRN [id number]  PCP:  Arch Beans, PA-C  Cardiologist:  None  Electrophysiologist:  None   Referring MD: Georgette Kins*   Chief Complaint  Patient presents with   Cerebrovascular Accident     History of Present Illness:    Garrett Garcia is a 73 y.o. 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  He was admitted to [facility] 02/2021 with double vision.  MRI showed punctate stroke at the right posterior parietal cortex.  Echocardiogram showed EF 65 to 70%, normal RV function, normal diastolic function, mild asymmetric hypertrophy of LV basal septum, no significant valvular disease.  Neurology was consulted and he was started on aspirin /Plavix  x3 weeks with plan for aspirin  alone moving forward.  30-day monitor showed no significant abnormalities.  Echo 06/27/2021 showed suboptimal bubble study but no shunting seen, EF 60 to 65%, mild dilatation of ascending aorta measuring 42 mm.  Cardiac MRI 07/25/2021 showed asymmetric LV hypertrophy measuring 14 mm and basal septum (not meeting criteria for HCM) and no evidence of amyloidosis, no LGE, normal biventricular size and systolic function.  Echocardiogram 02/2024 showed EF 60 to 65%, grade 1 diastolic dysfunction, normal RV function, no significant valvular disease, ascending aorta measuring 41 mm and aortic root measuring 40 mm.  Since last clinic visit, he reports he is doing well.  Does report has been feeling fatigued.  Denies any chest pain, dyspnea, lightheadedness, syncope, lower extremity edema, or palpitations.  Goes to gym 3 times per week.  No exertional symptoms.  Past Medical History:  Diagnosis Date   Prostate cancer (HCC) 2019   Treated with Radiation     Past Surgical History:  Procedure Laterality Date   gamma knife for pituitary  adenoma      Current Medications: Current Meds  Medication Sig   aspirin  EC 81 MG EC tablet Take 1 tablet (81 mg total) by mouth daily. Swallow whole.   Coenzyme Q10 50 MG CAPS Take by mouth daily.   lisinopril (ZESTRIL) 2.5 MG tablet Take 2.5 mg by mouth daily.   Multiple Vitamins-Minerals (MULTIVITAMIN ADULTS 50+) TABS Take 1 tablet by mouth daily.   sildenafil (REVATIO) 20 MG tablet as needed.   Tart Cherry 1200 MG CAPS Take by mouth daily.     Allergies:   Patient has no known allergies.   Social History   Socioeconomic History   Marital status: Married    Spouse name: Not on file   Number of children: Not on file   Years of education: Not on file   Highest education level: Not on file  Occupational History   Not on file  Tobacco Use   Smoking status: Former    Current packs/day: 0.00    Types: Cigarettes    Quit date: 44    Years since quitting: 53.3   Smokeless tobacco: Never  Substance and Sexual Activity   Alcohol use: Never   Drug use: Never   Sexual activity: Not on file  Other Topics Concern   Not on file  Social History Narrative   Lives at home with wife.  No assistance with ambulation.  Independent of ADLs.   Social Drivers of Corporate investment banker Strain: Low Risk  (08/30/2023)   Received from [facility]   Overall [employer] (  CARDIA)    Difficulty of Paying Living Expenses: Not hard at all  Food Insecurity: No Food Insecurity (08/30/2023)   Received from [facility]   Hunger Vital Sign    Worried About Running Out of Food in the Last Year: Never true    Ran Out of Food in the Last Year: Never true  Transportation Needs: No Transportation Needs (08/30/2023)   Received from [facility] - Transportation    Lack of Transportation (Medical): No    Lack of Transportation (Non-Medical): No  Physical Activity: Sufficiently Active (08/30/2023)   Received from [facility]   Exercise Vital Sign    Days of  Exercise per Week: 3 days    Minutes of Exercise per Session: 90 min  Stress: No Stress Concern Present (08/30/2023)   Received from [facility] - Occupational Stress Questionnaire    Feeling of Stress : Not at all  Social Connections: Socially Integrated (08/30/2023)   Received from [facility]   Social Network    How would you rate your social network (family, work, friends)?: Good participation with social networks     Family History: The patient's family history includes CAD in his father.  ROS:   Please see the history of present illness.     All other systems reviewed and are negative.  EKGs/Labs/Other Studies Reviewed:    The following studies were reviewed today:   EKG:  01/18/2022: Normal sinus rhythm, rate 74, no ST 03/04/2023: Normal sinus rhythm, rate 61, no ST abnormality 02/20/24: sinus rhythm with PACs, rate 69, no ST abnormality  Recent Labs: No results found for requested labs within last 365 days.  Recent Lipid Panel    Component Value Date/Time   CHOL 96 (L) 02/19/2023 0814   TRIG 39 02/19/2023 0814   HDL 47 02/19/2023 0814   CHOLHDL 2.0 02/19/2023 0814   CHOLHDL 3.5 03/08/2021 0247   VLDL 14 03/08/2021 0247   LDLCALC 38 02/19/2023 0814    Physical Exam:    VS:  BP 130/86   Pulse 69   Ht 6\' 3"  (1.905 m)   Wt 259 lb (117.5 kg)   SpO2 97%   BMI 32.37 kg/m     Wt Readings from Last 3 Encounters:  02/20/24 259 lb (117.5 kg)  03/04/23 257 lb (116.6 kg)  01/18/22 261 lb (118.4 kg)     GEN:  Well nourished, well developed in no acute distress HEENT: Normal NECK: No JVD; No carotid bruits LYMPHATICS: No lymphadenopathy CARDIAC: RRR, no murmurs, rubs, gallops RESPIRATORY:  Clear to auscultation without rales, wheezing or rhonchi  ABDOMEN: Soft, non-tender, non-distended MUSCULOSKELETAL:  No edema; No deformity  SKIN: Warm and dry NEUROLOGIC:  Alert and oriented x 3 PSYCHIATRIC:  Normal affect    ASSESSMENT:    1. Essential hypertension   2. Cerebrovascular accident (CVA), unspecified mechanism (HCC)   3. Hyperlipidemia, unspecified hyperlipidemia type   4. Snoring   5. Daytime somnolence   6. LVH (left ventricular hypertrophy)      PLAN:    CVA: Continue aspirin , statin.  30-day monitor shows no significant abnormalities.  Follows with neurology at [facility].  Echo 06/27/2021 showed suboptimal bubble study but no shunting seen, EF 60 to 65%, mild dilatation of ascending aorta measuring 42 mm.    LVH: Moderate asymmetric LVH of basal septum on echo by my read.  Differential includes hypertensive heart disease versus HCM versus amyloid.  Cardiac  MRI 07/25/2021 showed asymmetric LV hypertrophy measuring 14 mm and basal septum (not meeting criteria for HCM) and no evidence of amyloidosis, no LGE, normal biventricular size and systolic function. - Likely due to hypertension, no further work-up recommended at this time  Hyperlipidemia: LDL 48 on 10/10/2023.  Continue atorvastatin  20 mg daily.    Hypertension: On lisinopril 2.5 mg daily.  BP mildly elevated in clinic today but reports previously became lightheaded when tried to increase lisinopril to 5 mg daily.  Recommend checking BP daily over next few weeks and bring log to upcoming appointment with PCP  Aortic dilatation: Measured 42 mm in ascending aorta on echo 06/2021.  CTA chest 05/2022 showed stable ascending aorta measuring 41 mm.  Stable on echocardiogram 02/2024, aortic root measured 40 mm and ascending aorta 41 mm. - Aortic dilatation has been stable, will plan for repeat echocardiogram in 2 years to monitor  Snoring/daytime somnolence: Check Itamar sleep study.  STOP-BANG 5  RTC in 1 year   Medication Adjustments/Labs and Tests Ordered: Current medicines are reviewed at length with the patient today.  Concerns regarding medicines are outlined above.  Orders Placed This Encounter  Procedures   EKG 12-Lead   Itamar  Sleep Study    Meds ordered this encounter  Medications   atorvastatin  (LIPITOR) 20 MG tablet    Sig: Take 1 tablet (20 mg total) by mouth daily.    Dispense:  90 tablet    Refill:  3     Patient Instructions  Medication Instructions:  Continue current medications *If you need a refill on your cardiac medications before your next appointment, please call your pharmacy*  Lab Work: none If you have labs (blood work) drawn today and your tests are completely normal, you will receive your results only by: MyChart Message (if you have MyChart) OR A paper copy in the mail If you have any lab test that is abnormal or we need to change your treatment, we will call you to review the results.  Testing/Procedures: Itamar  WatchPAT?  Is a FDA cleared portable home sleep study test that uses a watch and 3 points of contact to monitor 7 different channels, including your heart rate, oxygen saturations, body position, snoring, and chest motion.  The study is easy to use from the comfort of your own home and accurately detect sleep apnea.  Before bed, you attach the chest sensor, attached the sleep apnea bracelet to your nondominant hand, and attach the finger probe.  After the study, the raw data is downloaded from the watch and scored for apnea events.   For more information: [email or web address]  Patient Testing Instructions:  Do not put battery into the device until bedtime when you are ready to begin the test. Please call the support number if you need assistance after following the instructions below: 24 hour support line- [phone] or ITAMAR support at [phone] (option 2)  Download the IntelWatchPAT One" app through the google play store or App Store  Be sure to turn on or enable access to bluetooth in settlings on your smartphone/ device  Make sure no other bluetooth devices are on and within the vicinity of your smartphone/ device and WatchPAT watch during  testing.  Make sure to leave your smart phone/ device plugged in and charging all night.  When ready for bed:  Follow the instructions step by step in the WatchPAT One App to activate the testing device. For additional instructions, including video instruction, visit the WatchPAT One  video on Youtube. You can search for WatchPat One within Youtube (video is 4 minutes and 18 seconds) or enter: https://youtube/watch?[email or web address]_vbiwxE Please note: You will be prompted to enter a Pin to connect via bluetooth when starting the test. The PIN will be assigned to you when you receive the test.  The device is disposable, but it recommended that you retain the device until you receive a call letting you know the study has been received and the results have been interpreted.  We will let you know if the study did not transmit to us  properly after the test is completed. You do not need to call us  to confirm the receipt of the test.  Please complete the test within 48 hours of receiving PIN.   Frequently Asked Questions:  What is Watch Deatra Face one?  A single use fully disposable home sleep apnea testing device and will not need to be returned after completion.  What are the requirements to use WatchPAT one?  The be able to have a successful watchpat one sleep study, you should have your Watch pat one device, your smart phone, watch pat one app, your PIN number and Internet access What type of phone do I need?  You should have a smart phone that uses Android 5.1 and above or any Iphone with IOS 10 and above How can I download the WatchPAT one app?  Based on your device type search for WatchPAT one app either in google play for android devices or APP store for Iphone's Where will I get my PIN for the study?  Your PIN will be provided by your physician's office. It is used for authentication and if you lose/forget your PIN, please reach out to your providers office.  I do not have Internet at home. Can I do WatchPAT one  study?  WatchPAT One needs Internet connection throughout the night to be able to transmit the sleep data. You can use your home/local internet or your cellular's data package. However, it is always recommended to use home/local Internet. It is estimated that between 20MB-30MB will be used with each study.However, the application will be looking for space in the phone to start the study.  What happens if I lose internet or bluetooth connection?  During the internet disconnection, your phone will not be able to transmit the sleep data. All the data, will be stored in your phone. As soon as the internet connection is back on, the phone will being sending the sleep data. During the bluetooth disconnection, WatchPAT one will not be able to to send the sleep data to your phone. Data will be kept in the WatchPAT one until two devices have bluetooth connection back on. As soon as the connection is back on, WatchPAT one will send the sleep data to the phone.  How long do I need to wear the WatchPAT one?  After you start the study, you should wear the device at least 6 hours.  How far should I keep my phone from the device?  During the night, your phone should be within 15 feet.  What happens if I leave the room for restroom or other reasons?  Leaving the room for any reason will not cause any problem. As soon as your get back to the room, both devices will reconnect and will continue to send the sleep data. Can I use my phone during the sleep study?  Yes, you can use your phone as usual during the study. But it is recommended  to put your watchpat one on when you are ready to go to bed.  How will I get my study results?  A soon as you completed your study, your sleep data will be sent to the provider. They will then share the results with you when they are ready.     Follow-Up: At [facility], you and your health needs are our priority.  As part of our continuing mission to provide you with  exceptional heart care, our providers are all part of one team.  This team includes your primary Cardiologist (physician) and Advanced Practice Providers or APPs (Physician Assistants and Nurse Practitioners) who all work together to provide you with the care you need, when you need it.  Your next appointment:   1 year(s)  Provider:   Dr. Alda Amas  We recommend signing up for the patient portal called "MyChart".  Sign up information is provided on this After Visit Summary.  MyChart is used to connect with patients for Virtual Visits (Telemedicine).  Patients are able to view lab/test results, encounter notes, upcoming appointments, etc.  Non-urgent messages can be sent to your provider as well.   To learn more about what you can do with MyChart, go to [email or web address].   Other Instructions Check blood pressure daily and bring that log to primary care appointment       Signed, Wendie Hamburg, MD  02/20/2024 12:21 PM    [facility]

## 2024-02-18 ENCOUNTER — Encounter: Payer: Self-pay | Admitting: *Deleted

## 2024-02-20 ENCOUNTER — Encounter: Payer: Self-pay | Admitting: Cardiology

## 2024-02-20 ENCOUNTER — Telehealth: Payer: Self-pay | Admitting: Radiology

## 2024-02-20 ENCOUNTER — Ambulatory Visit: Payer: Medicare HMO | Attending: Cardiology | Admitting: Cardiology

## 2024-02-20 VITALS — BP 130/86 | HR 69 | Ht 75.0 in | Wt 259.0 lb

## 2024-02-20 DIAGNOSIS — R400 Somnolence: Secondary | ICD-10-CM

## 2024-02-20 DIAGNOSIS — R0683 Snoring: Secondary | ICD-10-CM

## 2024-02-20 DIAGNOSIS — E785 Hyperlipidemia, unspecified: Secondary | ICD-10-CM

## 2024-02-20 DIAGNOSIS — I517 Cardiomegaly: Secondary | ICD-10-CM

## 2024-02-20 DIAGNOSIS — I10 Essential (primary) hypertension: Secondary | ICD-10-CM | POA: Diagnosis not present

## 2024-02-20 DIAGNOSIS — I639 Cerebral infarction, unspecified: Secondary | ICD-10-CM | POA: Diagnosis not present

## 2024-02-20 MED ORDER — ATORVASTATIN CALCIUM 20 MG PO TABS: 20.0000 mg | ORAL_TABLET | Freq: Every day | ORAL | 3 refills | Status: AC

## 2024-02-20 NOTE — Patient Instructions (Signed)
 Medication Instructions:  Continue current medications *If you need a refill on your cardiac medications before your next appointment, please call your pharmacy*  Lab Work: none If you have labs (blood work) drawn today and your tests are completely normal, you will receive your results only by: MyChart Message (if you have MyChart) OR A paper copy in the mail If you have any lab test that is abnormal or we need to change your treatment, we will call you to review the results.  Testing/Procedures: Itamar  WatchPAT?  Is a FDA cleared portable home sleep study test that uses a watch and 3 points of contact to monitor 7 different channels, including your heart rate, oxygen saturations, body position, snoring, and chest motion.  The study is easy to use from the comfort of your own home and accurately detect sleep apnea.  Before bed, you attach the chest sensor, attached the sleep apnea bracelet to your nondominant hand, and attach the finger probe.  After the study, the raw data is downloaded from the watch and scored for apnea events.   For more information: [email or web address]  Patient Testing Instructions:  Do not put battery into the device until bedtime when you are ready to begin the test. Please call the support number if you need assistance after following the instructions below: 24 hour support line- [phone] or ITAMAR support at [phone] (option 2)  Download the IntelWatchPAT One" app through the google play store or App Store  Be sure to turn on or enable access to bluetooth in settlings on your smartphone/ device  Make sure no other bluetooth devices are on and within the vicinity of your smartphone/ device and WatchPAT watch during testing.  Make sure to leave your smart phone/ device plugged in and charging all night.  When ready for bed:  Follow the instructions step by step in the WatchPAT One App to activate the testing device. For additional  instructions, including video instruction, visit the WatchPAT One video on Youtube. You can search for WatchPat One within Youtube (video is 4 minutes and 18 seconds) or enter: https://youtube/watch?v=BCce_vbiwxE Please note: You will be prompted to enter a Pin to connect via bluetooth when starting the test. The PIN will be assigned to you when you receive the test.  The device is disposable, but it recommended that you retain the device until you receive a call letting you know the study has been received and the results have been interpreted.  We will let you know if the study did not transmit to us  properly after the test is completed. You do not need to call us  to confirm the receipt of the test.  Please complete the test within 48 hours of receiving PIN.   Frequently Asked Questions:  What is Watch Deatra Face one?  A single use fully disposable home sleep apnea testing device and will not need to be returned after completion.  What are the requirements to use WatchPAT one?  The be able to have a successful watchpat one sleep study, you should have your Watch pat one device, your smart phone, watch pat one app, your PIN number and Internet access What type of phone do I need?  You should have a smart phone that uses Android 5.1 and above or any Iphone with IOS 10 and above How can I download the WatchPAT one app?  Based on your device type search for WatchPAT one app either in google play for android devices or APP store for Iphone's  Where will I get my PIN for the study?  Your PIN will be provided by your physician's office. It is used for authentication and if you lose/forget your PIN, please reach out to your providers office.  I do not have Internet at home. Can I do WatchPAT one study?  WatchPAT One needs Internet connection throughout the night to be able to transmit the sleep data. You can use your home/local internet or your cellular's data package. However, it is always recommended to use  home/local Internet. It is estimated that between 20MB-30MB will be used with each study.However, the application will be looking for space in the phone to start the study.  What happens if I lose internet or bluetooth connection?  During the internet disconnection, your phone will not be able to transmit the sleep data. All the data, will be stored in your phone. As soon as the internet connection is back on, the phone will being sending the sleep data. During the bluetooth disconnection, WatchPAT one will not be able to to send the sleep data to your phone. Data will be kept in the WatchPAT one until two devices have bluetooth connection back on. As soon as the connection is back on, WatchPAT one will send the sleep data to the phone.  How long do I need to wear the WatchPAT one?  After you start the study, you should wear the device at least 6 hours.  How far should I keep my phone from the device?  During the night, your phone should be within 15 feet.  What happens if I leave the room for restroom or other reasons?  Leaving the room for any reason will not cause any problem. As soon as your get back to the room, both devices will reconnect and will continue to send the sleep data. Can I use my phone during the sleep study?  Yes, you can use your phone as usual during the study. But it is recommended to put your watchpat one on when you are ready to go to bed.  How will I get my study results?  A soon as you completed your study, your sleep data will be sent to the provider. They will then share the results with you when they are ready.     Follow-Up: At [facility], you and your health needs are our priority.  As part of our continuing mission to provide you with exceptional heart care, our providers are all part of one team.  This team includes your primary Cardiologist (physician) and Advanced Practice Providers or APPs (Physician Assistants and Nurse Practitioners) who all work  together to provide you with the care you need, when you need it.  Your next appointment:   1 year(s)  Provider:   Dr. Alda Amas  We recommend signing up for the patient portal called "MyChart".  Sign up information is provided on this After Visit Summary.  MyChart is used to connect with patients for Virtual Visits (Telemedicine).  Patients are able to view lab/test results, encounter notes, upcoming appointments, etc.  Non-urgent messages can be sent to your provider as well.   To learn more about what you can do with MyChart, go to [email or web address].   Other Instructions Check blood pressure daily and bring that log to primary care appointment

## 2024-02-20 NOTE — Telephone Encounter (Signed)
 Patient agreement reviewed and signed on 02/20/2024.  WatchPAT issued to patient on 02/20/2024 by Nathalie Baize. Patient aware to not open the WatchPAT box until contacted with the activation PIN. Patient profile initialized in CloudPAT on 02/20/2024 by Jenise Mixer. Device serial number: [id number]  Please list Reason for Call as Advice Only and type "WatchPAT issued to patient" in the comment box.

## 2024-02-25 ENCOUNTER — Encounter: Payer: Self-pay | Admitting: Cardiology

## 2024-02-27 ENCOUNTER — Telehealth: Payer: Self-pay

## 2024-02-27 NOTE — Telephone Encounter (Signed)
**Note De-Identified Garrett Garcia Obfuscation** Ordering provider: Dr Alda Amas Associated diagnoses: Snoring-R06.83 and Somnolence-R40.0  WatchPAT PA obtained on 02/27/2024 by Garrett Garcia, [name], LPN. Authorization: Per the Water quality scientist at [place], [place] is not required for a CPT Code: 40981 (itamar-HST).  Patient notified of PIN (1234) on 02/27/2024 [name] Notification Method: MyChart message.  Phone note routed to covering staff for follow-up.

## 2024-03-01 ENCOUNTER — Encounter (INDEPENDENT_AMBULATORY_CARE_PROVIDER_SITE_OTHER): Payer: Self-pay | Admitting: Cardiology

## 2024-03-01 DIAGNOSIS — R0683 Snoring: Secondary | ICD-10-CM | POA: Diagnosis not present

## 2024-03-02 ENCOUNTER — Encounter: Payer: Self-pay | Admitting: Cardiology

## 2024-03-05 NOTE — Telephone Encounter (Signed)
 Called and unable to reach patient. Left message to call office for any questions about sleep study

## 2024-03-08 ENCOUNTER — Ambulatory Visit: Attending: Cardiology

## 2024-03-08 DIAGNOSIS — R400 Somnolence: Secondary | ICD-10-CM

## 2024-03-08 DIAGNOSIS — R0683 Snoring: Secondary | ICD-10-CM

## 2024-03-08 NOTE — Procedures (Signed)
     SLEEP STUDY REPORT Patient Information Study Date: 03/01/2024 Patient Name: Garrett Garcia Patient ID: [id number] Birth Date: 10/30/1950 Age: 73 Gender: Male BMI: 32.7 (W=260 lb, H=6' 3'') Stopbang: 5 Referring Physician: Carson Clara, MD  TEST DESCRIPTION: Home sleep apnea testing was completed using the WatchPat, a Type 1 device, utilizing peripheral arterial tonometry (PAT), chest movement, actigraphy, pulse oximetry, pulse rate, body position and snore. AHI was calculated with apnea and hypopnea using valid sleep time as the denominator. RDI includes apneas, hypopneas, and RERAs. The data acquired and the scoring of sleep and all associated events were performed in accordance with the recommended standards and specifications as outlined in the AASM Manual for the Scoring of Sleep and Associated Events 2.2.0 (2015).  FINDINGS: 1. No evidence of Obstructive Sleep Apnea with AHI 4.4/hr. 2. No Central Sleep Apnea. 3. Oxygen desaturations as low as 89%. 4. Mild snoring was present. O2 sats were < 88% for 0 minutes. 5. Total sleep time was 6 hrs and 39 min. 6. 19.7% of total sleep time was spent in REM sleep. 7. Normal sleep onset latency at 18 min. 8. Shortened REM sleep onset latency at 52 min. 9. Total awakenings were 7.  DIAGNOSIS: Normal study with no significant sleep disordered breathing.  RECOMMENDATIONS: 1. Normal study with no significant sleep disordered breathing. 2. Healthy sleep recommendations include: adequate nightly sleep (normal 7-9 hrs/night), avoidance of caffeine after noon and alcohol near bedtime, and maintaining a sleep environment that is cool, dark and quiet. 3. Weight loss for overweight patients is recommended. 4. Snoring recommendations include: weight loss where appropriate, side sleeping, and avoidance of alcohol before bed. 5. Operation of motor vehicle or dangerous equipment must be avoided when feeling drowsy, excessively  sleepy, or mentally fatigued. 6. An ENT consultation which may be useful for specific causes of and possible treatment of bothersome snoring . 7. Weight loss may be of benefit in reducing the severity of snoring.   Signature: Gaylyn Keas, MD; [facility]; Diplomat, American Board of Sleep Medicine Electronically Signed: 03/08/2024 6:21:42 PM

## 2024-03-10 ENCOUNTER — Telehealth: Payer: Self-pay | Admitting: *Deleted

## 2024-03-10 NOTE — Telephone Encounter (Signed)
-----   Message from Gaylyn Keas sent at 03/08/2024  6:24 PM EDT ----- Please let patient know that sleep study showed no significant sleep apnea.

## 2024-03-10 NOTE — Telephone Encounter (Signed)
 The patient has been notified of the result via his voicemail and his mychart. Gaylene Kays, CMA 03/10/2024 5:34 PM

## 2024-07-06 ENCOUNTER — Other Ambulatory Visit (HOSPITAL_BASED_OUTPATIENT_CLINIC_OR_DEPARTMENT_OTHER): Payer: Self-pay

## 2024-07-06 ENCOUNTER — Other Ambulatory Visit: Payer: Self-pay

## 2024-07-06 ENCOUNTER — Other Ambulatory Visit (HOSPITAL_COMMUNITY): Payer: Self-pay

## 2024-07-06 MED ORDER — FLUZONE HIGH-DOSE 0.5 ML IM SUSY
0.5000 mL | PREFILLED_SYRINGE | Freq: Once | INTRAMUSCULAR | 0 refills | Status: AC
  Filled 2024-07-06: qty 0.5, 1d supply, fill #0

## 2024-07-07 ENCOUNTER — Other Ambulatory Visit (HOSPITAL_BASED_OUTPATIENT_CLINIC_OR_DEPARTMENT_OTHER): Payer: Self-pay

## 2024-07-08 ENCOUNTER — Other Ambulatory Visit (HOSPITAL_BASED_OUTPATIENT_CLINIC_OR_DEPARTMENT_OTHER): Payer: Self-pay

## 2024-07-12 ENCOUNTER — Other Ambulatory Visit (HOSPITAL_BASED_OUTPATIENT_CLINIC_OR_DEPARTMENT_OTHER): Payer: Self-pay

## 2024-07-13 ENCOUNTER — Other Ambulatory Visit (HOSPITAL_BASED_OUTPATIENT_CLINIC_OR_DEPARTMENT_OTHER): Payer: Self-pay

## 2024-08-23 ENCOUNTER — Telehealth: Payer: Self-pay | Admitting: Cardiology

## 2024-08-23 NOTE — Telephone Encounter (Signed)
 Office needs a copy of patient last EKG fax to them [phone]. Please Advise

## 2024-08-31 ENCOUNTER — Encounter: Payer: Self-pay | Admitting: Cardiology

## 2024-09-02 NOTE — Telephone Encounter (Signed)
 Can we schedule for him to come in for an EKG?

## 2024-09-08 ENCOUNTER — Ambulatory Visit: Payer: Self-pay | Admitting: Podiatry

## 2024-09-08 DIAGNOSIS — M722 Plantar fascial fibromatosis: Secondary | ICD-10-CM

## 2024-09-08 DIAGNOSIS — M79672 Pain in left foot: Secondary | ICD-10-CM

## 2024-09-08 DIAGNOSIS — M79671 Pain in right foot: Secondary | ICD-10-CM

## 2024-09-08 MED ORDER — PREDNISONE 5 MG PO TABS
ORAL_TABLET | ORAL | 1 refills | Status: AC
Start: 2024-09-08 — End: ?

## 2024-09-08 NOTE — Progress Notes (Signed)
 Patient presents with complaint of pain on the plantar aspect of the right foot.  Had plantar fasciitis many years ago and it went away but is flared back up again pain along the entire to the arch especially at the distal half right foot.  Has not noticed any redness or swelling also has a history of gout.  He has been wearing insoles.  He has been doing stretching.   Physical exam:  General appearance: Pleasant, and in no acute distress. AOx3.  Vascular: Pedal pulses: DP 2 to/4 bilaterally, PT 2/4 bilaterally.  Moderate edema lower legs bilaterally. Capillary fill time immediate bilaterally.  Neurological: Light touch intact feet bilaterally.  Normal Achilles reflex bilaterally.  No clonus or spasticity noted.  Negative Tinel sign tarsal tunnel and porta pedis bilaterally  Dermatologic:   Skin normal temperature bilaterally.  Skin normal color, tone, and texture bilaterally.   Musculoskeletal: Tenderness along the plantar fascial band from the heel to the sulcus.  Tenderness especially in the distal one half of the plantar fascial band.  Also at the plantar medial aspect of the heel right.  No tenderness lateral compression the calcaneus.  No tenderness on the dorsal tarsal joints right foot.   Diagnosis: 1.  Pain right foot 2.  Plantar fasciitis right foot  Plan: -New patient office visit for evaluation and management level 3. - Discussed on plantar fasciitis and etiology and treatment given how inflamed the plantar fascia is will place him on oral prednisone  to try to reduce the inflammation.  If this does not reduce it we can always do an injection.  Discussed proper shoes.  He is always doing already doing stretching and icing.  Will try a night splint. -Dispensed night splint right - Rx prednisone  5 mg, 30 mg p.o. daily first day, then decrease by 5 mg every other day for 12 days.  Return 2 weeks follow-up plantar fascia right, possible injection

## 2024-09-14 ENCOUNTER — Ambulatory Visit

## 2024-09-22 ENCOUNTER — Ambulatory Visit: Admitting: Podiatry

## 2024-09-27 ENCOUNTER — Ambulatory Visit: Attending: Cardiology

## 2024-09-27 VITALS — BP 130/74 | HR 80 | Ht 75.6 in | Wt 257.8 lb

## 2024-09-27 DIAGNOSIS — I517 Cardiomegaly: Secondary | ICD-10-CM

## 2024-09-27 NOTE — Patient Instructions (Addendum)
° °  Nurse Visit   Date of Encounter: 09/27/2024 ID: Garrett Garcia, DOB 03/23/1951, MRN [id number]  PCP:  Darilyn Rosalva Bruckner, PA-C   [facility] Providers Cardiologist:  None      Visit Details  BP 130/74, hr 80, O2 98 % WT. 257.8LB  Wt Readings from Last 3 Encounters:  02/20/24 259 lb (117.5 kg)  03/04/23 257 lb (116.6 kg)  01/18/22 261 lb (118.4 kg)     Reason for visit: EKG Performed today: Vitals, EKG, Provider consulted:DOD 1, and Education Changes (medications, testing, etc.) : EKG Length of Visit: 10 minutes    Medications Adjustments/Labs and Tests Ordered: Orders Placed This Encounter  Procedures   EKG 12-Lead   No orders of the defined types were placed in this encounter.    Bonney Trudy Frankey JONELLE, RN  09/27/2024 10:54 AM

## 2024-09-28 ENCOUNTER — Encounter: Payer: Self-pay | Admitting: Cardiology

## 2024-09-30 NOTE — Telephone Encounter (Signed)
It should be released now.
